# Patient Record
Sex: Female | Born: 1965
Health system: Southern US, Community
[De-identification: ages and names within clinical notes are randomized; demographics above are authoritative.]

## PROBLEM LIST (undated history)

## (undated) DIAGNOSIS — F419 Anxiety disorder, unspecified: Secondary | ICD-10-CM

## (undated) HISTORY — PX: GYNECOLOGIC CRYOSURGERY: SHX857

## (undated) HISTORY — PX: WISDOM TOOTH EXTRACTION: SHX21

---

## 2001-05-20 ENCOUNTER — Emergency Department (HOSPITAL_COMMUNITY): Admission: EM | Admit: 2001-05-20 | Discharge: 2001-05-20 | Payer: Self-pay | Admitting: Emergency Medicine

## 2001-05-20 ENCOUNTER — Encounter: Payer: Self-pay | Admitting: Emergency Medicine

## 2001-06-16 ENCOUNTER — Other Ambulatory Visit: Admission: RE | Admit: 2001-06-16 | Discharge: 2001-06-16 | Payer: Self-pay | Admitting: Obstetrics and Gynecology

## 2005-10-22 ENCOUNTER — Other Ambulatory Visit: Admission: RE | Admit: 2005-10-22 | Discharge: 2005-10-22 | Payer: Self-pay | Admitting: Obstetrics and Gynecology

## 2005-11-26 ENCOUNTER — Ambulatory Visit (HOSPITAL_COMMUNITY): Admission: RE | Admit: 2005-11-26 | Discharge: 2005-11-26 | Payer: Self-pay | Admitting: Obstetrics and Gynecology

## 2006-11-04 ENCOUNTER — Other Ambulatory Visit: Admission: RE | Admit: 2006-11-04 | Discharge: 2006-11-04 | Payer: Self-pay | Admitting: Obstetrics and Gynecology

## 2007-11-10 ENCOUNTER — Other Ambulatory Visit: Admission: RE | Admit: 2007-11-10 | Discharge: 2007-11-10 | Payer: Self-pay | Admitting: Obstetrics and Gynecology

## 2007-11-24 ENCOUNTER — Ambulatory Visit (HOSPITAL_COMMUNITY): Admission: RE | Admit: 2007-11-24 | Discharge: 2007-11-24 | Payer: Self-pay | Admitting: Obstetrics and Gynecology

## 2013-05-25 ENCOUNTER — Other Ambulatory Visit: Payer: Self-pay | Admitting: Obstetrics and Gynecology

## 2013-05-25 ENCOUNTER — Other Ambulatory Visit (HOSPITAL_COMMUNITY)
Admission: RE | Admit: 2013-05-25 | Discharge: 2013-05-25 | Disposition: A | Discharge status: Home / Self Care | Payer: 59 | Source: Ambulatory Visit | Attending: Obstetrics and Gynecology | Admitting: Obstetrics and Gynecology

## 2013-05-25 DIAGNOSIS — Z01419 Encounter for gynecological examination (general) (routine) without abnormal findings: Secondary | ICD-10-CM | POA: Insufficient documentation

## 2013-05-25 DIAGNOSIS — Z1151 Encounter for screening for human papillomavirus (HPV): Secondary | ICD-10-CM | POA: Insufficient documentation

## 2013-05-28 ENCOUNTER — Other Ambulatory Visit: Payer: Self-pay

## 2013-05-28 DIAGNOSIS — Z1231 Encounter for screening mammogram for malignant neoplasm of breast: Secondary | ICD-10-CM

## 2013-06-22 ENCOUNTER — Ambulatory Visit
Admission: RE | Admit: 2013-06-22 | Discharge: 2013-06-22 | Disposition: A | Discharge status: Home / Self Care | Payer: 59 | Source: Ambulatory Visit

## 2013-06-22 DIAGNOSIS — Z1231 Encounter for screening mammogram for malignant neoplasm of breast: Secondary | ICD-10-CM

## 2014-06-14 ENCOUNTER — Other Ambulatory Visit (HOSPITAL_COMMUNITY)
Admission: RE | Admit: 2014-06-14 | Discharge: 2014-06-14 | Disposition: A | Discharge status: Home / Self Care | Payer: BC Managed Care – PPO | Source: Ambulatory Visit | Attending: Obstetrics and Gynecology | Admitting: Obstetrics and Gynecology

## 2014-06-14 ENCOUNTER — Other Ambulatory Visit: Payer: Self-pay | Admitting: Obstetrics and Gynecology

## 2014-06-14 DIAGNOSIS — Z01419 Encounter for gynecological examination (general) (routine) without abnormal findings: Secondary | ICD-10-CM | POA: Insufficient documentation

## 2014-06-16 ENCOUNTER — Other Ambulatory Visit: Payer: Self-pay

## 2014-06-16 DIAGNOSIS — Z1231 Encounter for screening mammogram for malignant neoplasm of breast: Secondary | ICD-10-CM

## 2014-06-16 LAB — CYTOLOGY - PAP

## 2014-07-19 ENCOUNTER — Other Ambulatory Visit: Payer: Self-pay | Admitting: Dermatology

## 2014-07-26 ENCOUNTER — Ambulatory Visit
Admission: RE | Admit: 2014-07-26 | Discharge: 2014-07-26 | Disposition: A | Discharge status: Home / Self Care | Payer: BC Managed Care – PPO | Source: Ambulatory Visit

## 2014-07-26 ENCOUNTER — Encounter (INDEPENDENT_AMBULATORY_CARE_PROVIDER_SITE_OTHER): Payer: Self-pay

## 2014-07-26 DIAGNOSIS — Z1231 Encounter for screening mammogram for malignant neoplasm of breast: Secondary | ICD-10-CM

## 2015-06-13 ENCOUNTER — Other Ambulatory Visit (HOSPITAL_COMMUNITY)
Admission: RE | Admit: 2015-06-13 | Discharge: 2015-06-13 | Disposition: A | Discharge status: Home / Self Care | Payer: BLUE CROSS/BLUE SHIELD | Source: Ambulatory Visit | Attending: Obstetrics and Gynecology | Admitting: Obstetrics and Gynecology

## 2015-06-13 ENCOUNTER — Other Ambulatory Visit: Payer: Self-pay | Admitting: Obstetrics and Gynecology

## 2015-06-13 DIAGNOSIS — Z01419 Encounter for gynecological examination (general) (routine) without abnormal findings: Secondary | ICD-10-CM | POA: Insufficient documentation

## 2015-06-14 LAB — CYTOLOGY - PAP

## 2015-06-27 ENCOUNTER — Other Ambulatory Visit: Payer: Self-pay

## 2015-06-27 DIAGNOSIS — Z1231 Encounter for screening mammogram for malignant neoplasm of breast: Secondary | ICD-10-CM

## 2015-08-01 ENCOUNTER — Ambulatory Visit
Admission: RE | Admit: 2015-08-01 | Discharge: 2015-08-01 | Disposition: A | Discharge status: Home / Self Care | Payer: BLUE CROSS/BLUE SHIELD | Source: Ambulatory Visit

## 2015-08-01 DIAGNOSIS — Z1231 Encounter for screening mammogram for malignant neoplasm of breast: Secondary | ICD-10-CM

## 2016-03-12 DIAGNOSIS — F419 Anxiety disorder, unspecified: Secondary | ICD-10-CM | POA: Diagnosis not present

## 2016-06-25 DIAGNOSIS — Z86018 Personal history of other benign neoplasm: Secondary | ICD-10-CM | POA: Diagnosis not present

## 2016-06-25 DIAGNOSIS — D225 Melanocytic nevi of trunk: Secondary | ICD-10-CM | POA: Diagnosis not present

## 2016-06-25 DIAGNOSIS — L814 Other melanin hyperpigmentation: Secondary | ICD-10-CM | POA: Diagnosis not present

## 2016-06-25 DIAGNOSIS — D1801 Hemangioma of skin and subcutaneous tissue: Secondary | ICD-10-CM | POA: Diagnosis not present

## 2016-07-02 ENCOUNTER — Other Ambulatory Visit: Payer: Self-pay | Admitting: Obstetrics and Gynecology

## 2016-07-02 DIAGNOSIS — Z1231 Encounter for screening mammogram for malignant neoplasm of breast: Secondary | ICD-10-CM

## 2016-07-30 DIAGNOSIS — F419 Anxiety disorder, unspecified: Secondary | ICD-10-CM | POA: Diagnosis not present

## 2016-08-20 ENCOUNTER — Other Ambulatory Visit (HOSPITAL_COMMUNITY)
Admission: RE | Admit: 2016-08-20 | Discharge: 2016-08-20 | Disposition: A | Discharge status: Home / Self Care | Payer: BLUE CROSS/BLUE SHIELD | Source: Ambulatory Visit | Attending: Obstetrics and Gynecology | Admitting: Obstetrics and Gynecology

## 2016-08-20 ENCOUNTER — Ambulatory Visit
Admission: RE | Admit: 2016-08-20 | Discharge: 2016-08-20 | Disposition: A | Discharge status: Home / Self Care | Payer: BLUE CROSS/BLUE SHIELD | Source: Ambulatory Visit | Attending: Obstetrics and Gynecology | Admitting: Obstetrics and Gynecology

## 2016-08-20 ENCOUNTER — Other Ambulatory Visit: Payer: Self-pay | Admitting: Obstetrics and Gynecology

## 2016-08-20 DIAGNOSIS — Z01419 Encounter for gynecological examination (general) (routine) without abnormal findings: Secondary | ICD-10-CM | POA: Insufficient documentation

## 2016-08-20 DIAGNOSIS — Z1151 Encounter for screening for human papillomavirus (HPV): Secondary | ICD-10-CM | POA: Diagnosis not present

## 2016-08-20 DIAGNOSIS — Z1231 Encounter for screening mammogram for malignant neoplasm of breast: Secondary | ICD-10-CM | POA: Diagnosis not present

## 2016-08-22 LAB — CYTOLOGY - PAP
Diagnosis: NEGATIVE
HPV (WINDOPATH): NOT DETECTED

## 2016-10-22 DIAGNOSIS — F419 Anxiety disorder, unspecified: Secondary | ICD-10-CM | POA: Diagnosis not present

## 2016-11-05 DIAGNOSIS — M21612 Bunion of left foot: Secondary | ICD-10-CM | POA: Diagnosis not present

## 2016-11-05 DIAGNOSIS — M7752 Other enthesopathy of left foot: Secondary | ICD-10-CM | POA: Diagnosis not present

## 2016-11-05 DIAGNOSIS — M21962 Unspecified acquired deformity of left lower leg: Secondary | ICD-10-CM | POA: Diagnosis not present

## 2016-11-05 DIAGNOSIS — M21961 Unspecified acquired deformity of right lower leg: Secondary | ICD-10-CM | POA: Diagnosis not present

## 2016-11-19 DIAGNOSIS — M21612 Bunion of left foot: Secondary | ICD-10-CM | POA: Diagnosis not present

## 2016-11-19 DIAGNOSIS — M7752 Other enthesopathy of left foot: Secondary | ICD-10-CM | POA: Diagnosis not present

## 2016-11-19 DIAGNOSIS — M21962 Unspecified acquired deformity of left lower leg: Secondary | ICD-10-CM | POA: Diagnosis not present

## 2016-11-19 DIAGNOSIS — M21961 Unspecified acquired deformity of right lower leg: Secondary | ICD-10-CM | POA: Diagnosis not present

## 2016-11-26 ENCOUNTER — Encounter: Payer: Self-pay | Admitting: Podiatry

## 2016-11-26 ENCOUNTER — Ambulatory Visit: Payer: BLUE CROSS/BLUE SHIELD

## 2016-11-26 ENCOUNTER — Ambulatory Visit (INDEPENDENT_AMBULATORY_CARE_PROVIDER_SITE_OTHER): Payer: BLUE CROSS/BLUE SHIELD | Admitting: Podiatry

## 2016-11-26 DIAGNOSIS — M21622 Bunionette of left foot: Secondary | ICD-10-CM | POA: Diagnosis not present

## 2016-11-26 DIAGNOSIS — M21612 Bunion of left foot: Secondary | ICD-10-CM

## 2016-11-26 DIAGNOSIS — M2012 Hallux valgus (acquired), left foot: Secondary | ICD-10-CM | POA: Diagnosis not present

## 2016-11-26 DIAGNOSIS — M21619 Bunion of unspecified foot: Secondary | ICD-10-CM

## 2016-11-26 DIAGNOSIS — M7752 Other enthesopathy of left foot: Secondary | ICD-10-CM | POA: Diagnosis not present

## 2016-11-26 DIAGNOSIS — M2042 Other hammer toe(s) (acquired), left foot: Secondary | ICD-10-CM

## 2016-11-26 MED ORDER — DICLOFENAC EPOLAMINE 1.3 % TD PTCH: 1.0000 | MEDICATED_PATCH | Freq: Two times a day (BID) | TRANSDERMAL | 10 refills | Status: DC

## 2016-11-26 MED ORDER — MELOXICAM 15 MG PO TABS: 15.0000 mg | ORAL_TABLET | Freq: Every day | ORAL | 1 refills | Status: AC

## 2016-11-27 ENCOUNTER — Telehealth: Payer: Self-pay | Admitting: *Deleted

## 2016-11-27 MED ORDER — NONFORMULARY OR COMPOUNDED ITEM: 2 refills | Status: AC

## 2016-11-27 NOTE — Telephone Encounter (Signed)
Please contact patient and give her the option.  Thanks, Dr. Logan BoresEvans

## 2016-11-27 NOTE — Telephone Encounter (Addendum)
Received notice Flextor patches are not in pt's insurance formulary. Left message informing pt the Flextor patch status and to call if would like to use the compound Dr. Logan BoresEvans orders. Pt states she would like to try the Compound. I gave pt the [facility] [phone], and explained they would call with insurance and delivery information. Pt states she would like to give Dr Logan BoresEvans a message, the shot he gave her yesterday did great, this is the most relief she has had in 4 month. Faxed orders to [facility].03/29/2017-Received request fax for Meloxicam. Refill denied pt was to reschedule for 1 week follow up. Return fax denying refill.04/12/2017-Received refill request for Meloxicam. Dr. Logan BoresEvans states pt needs to be seen prior to refills. Return fax denying Meloxicam refills.

## 2016-12-02 MED ORDER — BETAMETHASONE SOD PHOS & ACET 6 (3-3) MG/ML IJ SUSP: 3.0000 mg | Freq: Once | INTRAMUSCULAR | Status: AC

## 2016-12-02 NOTE — Progress Notes (Signed)
   Subjective:  Patient presents today as a new patient for evaluation of left foot pain is been going on for approximately 1 year now. Patient states is slowly progressing on worse. Patient states that she was seen at one point by a podiatrist who recommended surgery and nonweightbearing for approximately 3 months and refrain from work for approximately 6 months.. 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    Objective/Physical Exam General: The patient is alert and oriented x3 in no acute distress.  Dermatology: Skin is warm, dry and supple bilateral lower extremities. Negative for open lesions or macerations.  Vascular: Palpable pedal pulses bilaterally. No edema or erythema noted. Capillary refill within normal limits.  Neurological: Epicritic and protective threshold grossly intact bilaterally.   Musculoskeletal Exam: Clinical evidence of bunion deformity noted to the left lower extremity as well as tailor's bunion. Pain on palpation range of motion also noted to the second MPJ left foot.  Radiographic Exam:  Patient brought her x-rays from her last podiatrist visit approximately 3 weeks ago which do have indications of an increased intermetatarsal angle greater than 15 hallux adductus angle greater than 30. There is an intermetatarsal angle which is pathologically increase in the fourth interspace greater than 10. Hypertrophic lateral eminence of the fifth metatarsal head also noted.  Assessment: #1 hallux abductovalgus with bunion deformity left foot #2 tailor's bunion deformity left foot #3 second MPJ capsulitis left foot.   Plan of Care:  #1 Patient was evaluated. #2 today we discussed conservative versus surgical management of bunion and tailor's bunion as well as second MPJ capsulitis. Patient opts for surgical management. All details about complications of the surgery were explained. No guarantees were expressed or implied. #3  injection of 0.5 mL Celestone Soluspan injected in the second MPJ left foot. #4 prescription for meloxicam 15 mg Exline #5 prescription for diclofenac patches Exline #6 patient would like to have surgery in the fall of this year. Return to clinic in 6 months for authorization for surgery.  Patient is a Scientist, physiologicalveterinary ophthalmologist.   Felecia ShellingBrent M. Aamori Mcmasters, DPM [facility]  Dr. Felecia ShellingBrent M. Adithya Difrancesco, DPM    [address]                                        [place], [place] [address]                Office [phone]  Fax [phone]

## 2016-12-24 DIAGNOSIS — F419 Anxiety disorder, unspecified: Secondary | ICD-10-CM | POA: Diagnosis not present

## 2016-12-24 DIAGNOSIS — R748 Abnormal levels of other serum enzymes: Secondary | ICD-10-CM | POA: Diagnosis not present

## 2016-12-24 DIAGNOSIS — R740 Nonspecific elevation of levels of transaminase and lactic acid dehydrogenase [LDH]: Secondary | ICD-10-CM | POA: Diagnosis not present

## 2016-12-24 DIAGNOSIS — Z23 Encounter for immunization: Secondary | ICD-10-CM | POA: Diagnosis not present

## 2016-12-31 DIAGNOSIS — F419 Anxiety disorder, unspecified: Secondary | ICD-10-CM | POA: Diagnosis not present

## 2017-01-02 ENCOUNTER — Other Ambulatory Visit: Payer: Self-pay | Admitting: Family Medicine

## 2017-01-02 DIAGNOSIS — R748 Abnormal levels of other serum enzymes: Secondary | ICD-10-CM

## 2017-01-21 ENCOUNTER — Ambulatory Visit (INDEPENDENT_AMBULATORY_CARE_PROVIDER_SITE_OTHER): Payer: BLUE CROSS/BLUE SHIELD | Admitting: Podiatry

## 2017-01-21 DIAGNOSIS — M21619 Bunion of unspecified foot: Secondary | ICD-10-CM | POA: Diagnosis not present

## 2017-01-21 DIAGNOSIS — M21612 Bunion of left foot: Secondary | ICD-10-CM

## 2017-01-21 DIAGNOSIS — M2012 Hallux valgus (acquired), left foot: Secondary | ICD-10-CM

## 2017-01-21 DIAGNOSIS — M7752 Other enthesopathy of left foot: Secondary | ICD-10-CM | POA: Diagnosis not present

## 2017-01-21 DIAGNOSIS — M2042 Other hammer toe(s) (acquired), left foot: Secondary | ICD-10-CM

## 2017-01-21 DIAGNOSIS — M21622 Bunionette of left foot: Secondary | ICD-10-CM

## 2017-01-22 NOTE — Progress Notes (Signed)
Subjective:  Patient presents today for follow up evaluation of left foot pain. Patient states is progressively worsening. She states the injection was helpful, but the pain has now returned. Increased activity recently caused an exacerbation of the pain.   Objective/Physical Exam General: The patient is alert and oriented x3 in no acute distress.  Dermatology: Skin is warm, dry and supple bilateral lower extremities. Negative for open lesions or macerations.  Vascular: Palpable pedal pulses bilaterally. No edema or erythema noted. Capillary refill within normal limits.  Neurological: Epicritic and protective threshold grossly intact bilaterally.   Musculoskeletal Exam: Clinical evidence of bunion deformity noted to the left lower extremity as well as tailor's bunion. Pain on palpation range of motion also noted to the second MPJ left foot.   Assessment: #1 hallux abductovalgus with bunion deformity left foot #2 tailor's bunion deformity left foot #3 second MPJ capsulitis left foot.   Plan of Care:  #1 Patient was evaluated. #2 injection of 0.5 mL Celestone Soluspan injected in the second MPJ left foot. #3 silicone toe caps dispensed. #4 return to clinic 1 week postop.  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   Felecia Shelling, DPM [facility]  Dr. Felecia Shelling, DPM    [address]                                             [place], [place] [address]                Office [phone]  Fax [phone]

## 2017-01-27 MED ORDER — BETAMETHASONE SOD PHOS & ACET 6 (3-3) MG/ML IJ SUSP: 3.0000 mg | Freq: Once | INTRAMUSCULAR | Status: AC

## 2017-03-04 DIAGNOSIS — M79672 Pain in left foot: Secondary | ICD-10-CM | POA: Diagnosis not present

## 2017-03-04 DIAGNOSIS — M21612 Bunion of left foot: Secondary | ICD-10-CM | POA: Diagnosis not present

## 2017-03-04 DIAGNOSIS — M205X2 Other deformities of toe(s) (acquired), left foot: Secondary | ICD-10-CM | POA: Diagnosis not present

## 2017-03-04 DIAGNOSIS — G8929 Other chronic pain: Secondary | ICD-10-CM | POA: Diagnosis not present

## 2017-03-07 ENCOUNTER — Other Ambulatory Visit: Payer: Self-pay | Admitting: Orthopedic Surgery

## 2017-03-08 ENCOUNTER — Telehealth: Payer: Self-pay | Admitting: *Deleted

## 2017-03-08 NOTE — Telephone Encounter (Signed)
"  I am actually down for surgery with Dr. Clayburn PertEvan on the 8th of November.  I'm having some issues so I wanted to go ahead and cancel that for now.  Please cancel that and give me a call back to confirm that you did cancel that.  I'm not sure of where I'm going to be at that time.  It's fine to leave me a message confirming that it's canceled if I can't pick up."

## 2017-03-08 NOTE — Telephone Encounter (Signed)
I called and left her a message that I canceled her surgery for August 08, 2017.  I called and informed Ar[facility]ynthia [facility].

## 2017-03-21 ENCOUNTER — Encounter (HOSPITAL_BASED_OUTPATIENT_CLINIC_OR_DEPARTMENT_OTHER): Payer: Self-pay | Admitting: *Deleted

## 2017-03-25 ENCOUNTER — Other Ambulatory Visit: Payer: BLUE CROSS/BLUE SHIELD

## 2017-03-28 ENCOUNTER — Encounter (HOSPITAL_BASED_OUTPATIENT_CLINIC_OR_DEPARTMENT_OTHER): Payer: Self-pay

## 2017-03-28 ENCOUNTER — Ambulatory Visit (HOSPITAL_BASED_OUTPATIENT_CLINIC_OR_DEPARTMENT_OTHER): Payer: BLUE CROSS/BLUE SHIELD | Admitting: Certified Registered"

## 2017-03-28 ENCOUNTER — Ambulatory Visit (HOSPITAL_BASED_OUTPATIENT_CLINIC_OR_DEPARTMENT_OTHER)
Admission: RE | Admit: 2017-03-28 | Discharge: 2017-03-28 | Disposition: A | Discharge status: Home / Self Care | Payer: BLUE CROSS/BLUE SHIELD | Source: Ambulatory Visit | Attending: Orthopedic Surgery | Admitting: Orthopedic Surgery

## 2017-03-28 ENCOUNTER — Encounter (HOSPITAL_BASED_OUTPATIENT_CLINIC_OR_DEPARTMENT_OTHER)
Admission: RE | Disposition: A | Discharge status: Home / Self Care | Payer: Self-pay | Source: Ambulatory Visit | Attending: Orthopedic Surgery

## 2017-03-28 DIAGNOSIS — M205X2 Other deformities of toe(s) (acquired), left foot: Secondary | ICD-10-CM | POA: Diagnosis not present

## 2017-03-28 DIAGNOSIS — M2042 Other hammer toe(s) (acquired), left foot: Secondary | ICD-10-CM | POA: Insufficient documentation

## 2017-03-28 DIAGNOSIS — M21612 Bunion of left foot: Secondary | ICD-10-CM | POA: Diagnosis not present

## 2017-03-28 DIAGNOSIS — Z880 Allergy status to penicillin: Secondary | ICD-10-CM | POA: Insufficient documentation

## 2017-03-28 DIAGNOSIS — F419 Anxiety disorder, unspecified: Secondary | ICD-10-CM | POA: Insufficient documentation

## 2017-03-28 DIAGNOSIS — Z9889 Other specified postprocedural states: Secondary | ICD-10-CM

## 2017-03-28 DIAGNOSIS — Z79899 Other long term (current) drug therapy: Secondary | ICD-10-CM | POA: Insufficient documentation

## 2017-03-28 DIAGNOSIS — M216X2 Other acquired deformities of left foot: Secondary | ICD-10-CM | POA: Diagnosis not present

## 2017-03-28 DIAGNOSIS — G8918 Other acute postprocedural pain: Secondary | ICD-10-CM | POA: Diagnosis not present

## 2017-03-28 HISTORY — PX: HAMMERTOE RECONSTRUCTION WITH WEIL OSTEOTOMY: SHX5631

## 2017-03-28 HISTORY — DX: Anxiety disorder, unspecified: F41.9

## 2017-03-28 HISTORY — PX: METATARSAL OSTEOTOMY: SHX1641

## 2017-03-28 SURGERY — OSTEOTOMY, METATARSAL BONE
Anesthesia: Regional | Laterality: Left

## 2017-03-28 MED ORDER — CEFAZOLIN SODIUM-DEXTROSE 2-4 GM/100ML-% IV SOLN
INTRAVENOUS | Status: AC
  Filled 2017-03-28: qty 100

## 2017-03-28 MED ORDER — KETOROLAC TROMETHAMINE 30 MG/ML IJ SOLN: 30.0000 mg | Freq: Once | INTRAMUSCULAR | Status: DC | PRN

## 2017-03-28 MED ORDER — FENTANYL CITRATE (PF) 100 MCG/2ML IJ SOLN
INTRAMUSCULAR | Status: AC
  Filled 2017-03-28: qty 2

## 2017-03-28 MED ORDER — OXYCODONE HCL 5 MG/5ML PO SOLN: 5.0000 mg | Freq: Once | ORAL | Status: DC | PRN

## 2017-03-28 MED ORDER — DOCUSATE SODIUM 100 MG PO CAPS: 100.0000 mg | ORAL_CAPSULE | Freq: Two times a day (BID) | ORAL | 0 refills | Status: AC

## 2017-03-28 MED ORDER — OXYCODONE HCL 5 MG PO TABS: 5.0000 mg | ORAL_TABLET | Freq: Once | ORAL | Status: DC | PRN

## 2017-03-28 MED ORDER — MEPERIDINE HCL 25 MG/ML IJ SOLN: 6.2500 mg | INTRAMUSCULAR | Status: DC | PRN

## 2017-03-28 MED ORDER — FENTANYL CITRATE (PF) 100 MCG/2ML IJ SOLN
50.0000 ug | INTRAMUSCULAR | Status: DC | PRN
  Administered 2017-03-28: 50 ug via INTRAVENOUS

## 2017-03-28 MED ORDER — LACTATED RINGERS IV SOLN
INTRAVENOUS | Status: DC
  Administered 2017-03-28 (×2): via INTRAVENOUS

## 2017-03-28 MED ORDER — HYDROMORPHONE HCL 1 MG/ML IJ SOLN: 0.2500 mg | INTRAMUSCULAR | Status: DC | PRN

## 2017-03-28 MED ORDER — MIDAZOLAM HCL 2 MG/2ML IJ SOLN
INTRAMUSCULAR | Status: AC
  Filled 2017-03-28: qty 2

## 2017-03-28 MED ORDER — CHLORHEXIDINE GLUCONATE 4 % EX LIQD: 60.0000 mL | Freq: Once | CUTANEOUS | Status: DC

## 2017-03-28 MED ORDER — PROMETHAZINE HCL 25 MG/ML IJ SOLN: 6.2500 mg | INTRAMUSCULAR | Status: DC | PRN

## 2017-03-28 MED ORDER — DEXAMETHASONE SODIUM PHOSPHATE 10 MG/ML IJ SOLN
INTRAMUSCULAR | Status: DC | PRN
  Administered 2017-03-28: 10 mg via INTRAVENOUS

## 2017-03-28 MED ORDER — SODIUM CHLORIDE 0.9 % IV SOLN: INTRAVENOUS | Status: DC

## 2017-03-28 MED ORDER — ONDANSETRON HCL 4 MG/2ML IJ SOLN
INTRAMUSCULAR | Status: DC | PRN
  Administered 2017-03-28: 4 mg via INTRAVENOUS

## 2017-03-28 MED ORDER — OXYCODONE HCL 5 MG PO TABS: 5.0000 mg | ORAL_TABLET | ORAL | 0 refills | Status: AC | PRN

## 2017-03-28 MED ORDER — LIDOCAINE HCL (CARDIAC) 20 MG/ML IV SOLN
INTRAVENOUS | Status: DC | PRN
  Administered 2017-03-28: 30 mg via INTRAVENOUS

## 2017-03-28 MED ORDER — BUPIVACAINE-EPINEPHRINE (PF) 0.5% -1:200000 IJ SOLN
INTRAMUSCULAR | Status: DC | PRN
  Administered 2017-03-28: 30 mL via PERINEURAL

## 2017-03-28 MED ORDER — SENNA 8.6 MG PO TABS: 2.0000 | ORAL_TABLET | Freq: Two times a day (BID) | ORAL | 0 refills | Status: AC

## 2017-03-28 MED ORDER — SCOPOLAMINE 1 MG/3DAYS TD PT72: 1.0000 | MEDICATED_PATCH | Freq: Once | TRANSDERMAL | Status: DC | PRN

## 2017-03-28 MED ORDER — PROPOFOL 10 MG/ML IV BOLUS
INTRAVENOUS | Status: DC | PRN
  Administered 2017-03-28: 200 mg via INTRAVENOUS

## 2017-03-28 MED ORDER — CEFAZOLIN SODIUM-DEXTROSE 2-4 GM/100ML-% IV SOLN
2.0000 g | INTRAVENOUS | Status: AC
  Administered 2017-03-28: 2 g via INTRAVENOUS

## 2017-03-28 MED ORDER — MIDAZOLAM HCL 2 MG/2ML IJ SOLN
1.0000 mg | INTRAMUSCULAR | Status: DC | PRN
  Administered 2017-03-28: 2 mg via INTRAVENOUS

## 2017-03-28 SURGICAL SUPPLY — 84 items
BANDAGE ESMARK 6X9 LF (GAUZE/BANDAGES/DRESSINGS) IMPLANT
BIT DRILL 1.5X30 QC DISP (BIT) ×3 IMPLANT
BIT DRILL 1.8 CANN MAX VPC (BIT) ×3 IMPLANT
BIT DRILL 2.0 (BIT) ×1
BIT DRILL 2.0MM (BIT) ×1
BIT DRILL 2XNS DISP SS SM FRAG (BIT) ×1 IMPLANT
BIT DRL 2XNS DISP SS SM FRAG (BIT) ×1
BLADE AVERAGE 25MMX9MM (BLADE)
BLADE AVERAGE 25X9 (BLADE) IMPLANT
BLADE LONG MED 25X9 (BLADE) IMPLANT
BLADE LONG MED 25X9MM (BLADE)
BLADE OSC/SAG .038X5.5 CUT EDG (BLADE) IMPLANT
BLADE SURG 15 STRL LF DISP TIS (BLADE) ×2 IMPLANT
BLADE SURG 15 STRL SS (BLADE) ×4
BNDG COHESIVE 4X5 TAN STRL (GAUZE/BANDAGES/DRESSINGS) ×3 IMPLANT
BNDG COHESIVE 6X5 TAN STRL LF (GAUZE/BANDAGES/DRESSINGS) ×3 IMPLANT
BNDG CONFORM 2 STRL LF (GAUZE/BANDAGES/DRESSINGS) IMPLANT
BNDG CONFORM 3 STRL LF (GAUZE/BANDAGES/DRESSINGS) ×3 IMPLANT
BNDG ESMARK 4X9 LF (GAUZE/BANDAGES/DRESSINGS) IMPLANT
BNDG ESMARK 6X9 LF (GAUZE/BANDAGES/DRESSINGS)
CAP PIN PROTECTOR ORTHO WHT (CAP) IMPLANT
CHLORAPREP W/TINT 26ML (MISCELLANEOUS) ×3 IMPLANT
COVER BACK TABLE 60X90IN (DRAPES) ×3 IMPLANT
CUFF TOURNIQUET SINGLE 24IN (TOURNIQUET CUFF) IMPLANT
CUFF TOURNIQUET SINGLE 34IN LL (TOURNIQUET CUFF) IMPLANT
DRAPE EXTREMITY T 121X128X90 (DRAPE) ×3 IMPLANT
DRAPE OEC MINIVIEW 54X84 (DRAPES) ×3 IMPLANT
DRAPE U-SHAPE 47X51 STRL (DRAPES) ×3 IMPLANT
DRSG MEPITEL 4X7.2 (GAUZE/BANDAGES/DRESSINGS) ×3 IMPLANT
DRSG PAD ABDOMINAL 8X10 ST (GAUZE/BANDAGES/DRESSINGS) ×3 IMPLANT
ELECT REM PT RETURN 9FT ADLT (ELECTROSURGICAL) ×3
ELECTRODE REM PT RTRN 9FT ADLT (ELECTROSURGICAL) ×1 IMPLANT
GAUZE SPONGE 4X4 12PLY STRL (GAUZE/BANDAGES/DRESSINGS) ×3 IMPLANT
GAUZE SPONGE 4X4 12PLY STRL LF (GAUZE/BANDAGES/DRESSINGS) ×3 IMPLANT
GLOVE BIO SURGEON STRL SZ8 (GLOVE) ×3 IMPLANT
GLOVE BIOGEL PI IND STRL 8 (GLOVE) ×2 IMPLANT
GLOVE BIOGEL PI INDICATOR 8 (GLOVE) ×4
GLOVE ECLIPSE 8.0 STRL XLNG CF (GLOVE) ×3 IMPLANT
GOWN STRL REUS W/ TWL LRG LVL3 (GOWN DISPOSABLE) ×1 IMPLANT
GOWN STRL REUS W/ TWL XL LVL3 (GOWN DISPOSABLE) ×2 IMPLANT
GOWN STRL REUS W/TWL LRG LVL3 (GOWN DISPOSABLE) ×2
GOWN STRL REUS W/TWL XL LVL3 (GOWN DISPOSABLE) ×4
K-WIRE .054X4 (WIRE) IMPLANT
K-WIRE COCR 0.9X95 (WIRE) ×3
K-WIRE DBL END .054 LG (WIRE) IMPLANT
KWIRE COCR 0.9X95 (WIRE) ×1 IMPLANT
NEEDLE HYPO 22GX1.5 SAFETY (NEEDLE) IMPLANT
NS IRRIG 1000ML POUR BTL (IV SOLUTION) ×3 IMPLANT
PACK BASIN DAY SURGERY FS (CUSTOM PROCEDURE TRAY) ×3 IMPLANT
PAD CAST 4YDX4 CTTN HI CHSV (CAST SUPPLIES) ×1 IMPLANT
PADDING CAST ABS 4INX4YD NS (CAST SUPPLIES) ×2
PADDING CAST ABS COTTON 4X4 ST (CAST SUPPLIES) ×1 IMPLANT
PADDING CAST COTTON 4X4 STRL (CAST SUPPLIES) ×2
PASSER SUT SWANSON 36MM LOOP (INSTRUMENTS) IMPLANT
PENCIL BUTTON HOLSTER BLD 10FT (ELECTRODE) ×3 IMPLANT
SANITIZER HAND PURELL 535ML FO (MISCELLANEOUS) ×3 IMPLANT
SCREW 2.0 CORTICAL FT 20MM (Screw) ×3 IMPLANT
SCREW 2.0MM CORTICAL FT 22MM (Screw) ×3 IMPLANT
SCREW 2.0MM CORTICAL FT 28MM (Screw) ×3 IMPLANT
SCREW CORTICAL 2.0X16 (Screw) ×6 IMPLANT
SCREW HCS TWIST-OFF 2.0X12MM (Screw) ×3 IMPLANT
SCREW HCS TWIST-OFF 2.0X14MM (Screw) ×3 IMPLANT
SCREW MAX VPC  2.5X20 (Screw) ×2 IMPLANT
SCREW MAX VPC 2.5X20 (Screw) ×1 IMPLANT
SHEET MEDIUM DRAPE 40X70 STRL (DRAPES) ×3 IMPLANT
SLEEVE SCD COMPRESS KNEE MED (MISCELLANEOUS) ×3 IMPLANT
SPONGE LAP 18X18 X RAY DECT (DISPOSABLE) ×3 IMPLANT
STOCKINETTE 6  STRL (DRAPES) ×2
STOCKINETTE 6 STRL (DRAPES) ×1 IMPLANT
SUCTION FRAZIER HANDLE 10FR (MISCELLANEOUS) ×2
SUCTION TUBE FRAZIER 10FR DISP (MISCELLANEOUS) ×1 IMPLANT
SUT ETHILON 3 0 PS 1 (SUTURE) ×6 IMPLANT
SUT MNCRL AB 3-0 PS2 18 (SUTURE) ×3 IMPLANT
SUT VIC AB 0 SH 27 (SUTURE) IMPLANT
SUT VIC AB 2-0 SH 27 (SUTURE) ×4
SUT VIC AB 2-0 SH 27XBRD (SUTURE) ×2 IMPLANT
SUT VICRYL 0 UR6 27IN ABS (SUTURE) IMPLANT
SYR BULB 3OZ (MISCELLANEOUS) ×3 IMPLANT
SYR CONTROL 10ML LL (SYRINGE) IMPLANT
TOWEL OR 17X24 6PK STRL BLUE (TOWEL DISPOSABLE) ×3 IMPLANT
TUBE CONNECTING 20'X1/4 (TUBING) ×1
TUBE CONNECTING 20X1/4 (TUBING) ×2 IMPLANT
UNDERPAD 30X30 (UNDERPADS AND DIAPERS) ×3 IMPLANT
YANKAUER SUCT BULB TIP NO VENT (SUCTIONS) IMPLANT

## 2017-03-28 NOTE — Anesthesia Procedure Notes (Signed)
Procedure Name: LMA Insertion Date/Time: 03/28/2017 9:57 AM Performed by: Satcha Storlie D Pre-anesthesia Checklist: Patient identified, Emergency Drugs available, Suction available and Patient being monitored Patient Re-evaluated:Patient Re-evaluated prior to inductionOxygen Delivery Method: Circle system utilized Preoxygenation: Pre-oxygenation with 100% oxygen Intubation Type: IV induction Ventilation: Mask ventilation without difficulty LMA: LMA inserted LMA Size: 3.0 Number of attempts: 1 Airway Equipment and Method: Bite block Placement Confirmation: positive ETCO2 Tube secured with: Tape Dental Injury: Teeth and Oropharynx as per pre-operative assessment

## 2017-03-28 NOTE — Anesthesia Postprocedure Evaluation (Signed)
Anesthesia Post Note  Patient: Lori HymenKelly [name]  Procedure(s) Performed: Procedure(s) (LRB): Left 1st metatarsal Scarf osteotomy, modified McBride bunionectomy, possible Akin osteotomy; Left 2nd hammertoe correction; Weil osteotomy and flexor to extensor transfer (Left) HAMMERTOE RECONSTRUCTION WITH WEIL OSTEOTOMY EXCISION OF LEFT BUNIONETTE (Left)     Patient location during evaluation: PACU Anesthesia Type: Regional and General Level of consciousness: sedated and patient cooperative Pain management: pain level controlled Vital Signs Assessment: post-procedure vital signs reviewed and stable Respiratory status: spontaneous breathing Cardiovascular status: stable Anesthetic complications: no    Last Vitals:  Vitals:   03/28/17 1215 03/28/17 1230  BP: 118/86 129/83  Pulse: 91 80  Resp: (!) 21 18  Temp:  36.4 C    Last Pain:  Vitals:   03/28/17 1230  TempSrc:   PainSc: 0-No pain                 Lewie LoronJohn Camp Gopal

## 2017-03-28 NOTE — Transfer of Care (Signed)
Immediate Anesthesia Transfer of Care Note  Patient: Lori Madden  Procedure(s) Performed: Procedure(s): Left 1st metatarsal Scarf osteotomy, modified McBride bunionectomy, possible Akin osteotomy; Left 2nd hammertoe correction; Weil osteotomy and flexor to extensor transfer (Left) HAMMERTOE RECONSTRUCTION WITH WEIL OSTEOTOMY EXCISION OF LEFT BUNIONETTE (Left)  Patient Location: PACU  Anesthesia Type:GA combined with regional for post-op pain  Level of Consciousness: awake and patient cooperative  Airway & Oxygen Therapy: Patient Spontanous Breathing and Patient connected to face mask oxygen  Post-op Assessment: Report given to RN and Post -op Vital signs reviewed and stable  Post vital signs: Reviewed and stable  Last Vitals:  Vitals:   03/28/17 0920 03/28/17 1146  BP:  98/67  Pulse: 66 75  Resp: 17 (P) 18  Temp:      Last Pain:  Vitals:   03/28/17 0813  TempSrc: Oral  PainSc: 8       Patients Stated Pain Goal: 1 (03/28/17 0813)  Complications: No apparent anesthesia complications

## 2017-03-28 NOTE — Brief Op Note (Addendum)
03/28/2017  11:48 AM  PATIENT:  Lori Madden  51 y.o. female  PRE-OPERATIVE DIAGNOSIS:  Left 2nd hammertoe and bunion  POST-OPERATIVE DIAGNOSIS:  Left 2nd hammertoe and bunion  Procedure(s): 1.  Left modified McBride bunionectomy, 1st MT scarf and Akin osteotomies 2.  Left 2nd MT Weil osteotomy with dorsal capsulotomy and extensor tendon lengthening.(separate incision) 3.  Left 2nd hammertoe correction with flexor to extensor transfer (separate incision) 4.  Left 2nd toe open flexor tendon release (separate incision) 5.  Left foot AP and lateral xrays 6.  Excision of left foot bunionette  SURGEON:  Toni ArthursJohn Jaylynn Siefert, MD  ASSISTANT: Alfredo MartinezJustin Ollis, PA-C  ANESTHESIA:   General, regional  EBL:  minimal   TOURNIQUET:  approx 90 min at 250 mm Hg  COMPLICATIONS:  None apparent  DISPOSITION:  Extubated, awake and stable to recovery.  DICTATION ID:  [id number]

## 2017-03-28 NOTE — Anesthesia Procedure Notes (Addendum)
Anesthesia Regional Block: Popliteal block   Pre-Anesthetic Checklist: ,, timeout performed, Correct Patient, Correct Site, Correct Laterality, Correct Procedure, Correct Position, site marked, Risks and benefits discussed,  Surgical consent,  Pre-op evaluation,  At surgeon's request and post-op pain management  Laterality: Left  Prep: chloraprep       Needles:  Injection technique: Single-shot  Needle Type: Stimiplex     Needle Length: 10cm  Needle Gauge: 21     Additional Needles:   Procedures: ultrasound guided,,,,,,,,  Motor weakness within 5 minutes.   Nerve Stimulator or Paresthesia:  Response: Plantar flexion/toe flexion, 0.5 mA,   Additional Responses:   Narrative:  Start time: 03/28/2017 9:05 AM End time: 03/28/2017 9:11 AM Injection made incrementally with aspirations every 5 mL.  Performed by: Personally  Anesthesiologist: Lewie LoronGERMEROTH, Taygan Connell  Additional Notes: Nerve located and needle positioned with direct ultrasound guidance. Good perineural spread. Patient tolerated well.

## 2017-03-28 NOTE — H&P (Signed)
Lori Madden is an 51 y.o. female.   Chief Complaint: left foot pain HPI: 51 y/o female with left foot pain for many months.  She has a painful bunion and hammertoe deformity as well as a bunionette.  She has failed non op treatment and presents today for surgical correction of these painful deformities.  Past Medical History:  Diagnosis Date  . Anxiety     Past Surgical History:  Procedure Laterality Date  . GYNECOLOGIC CRYOSURGERY    . WISDOM TOOTH EXTRACTION      History reviewed. No pertinent family history. Social History:  has an unknown smoking status. She has never used smokeless tobacco. She reports that she does not drink alcohol or use drugs.  Allergies:  Allergies  Allergen Reactions  . Penicillins Hives  . Tylenol [Acetaminophen]     Facility-Administered Medications Prior to Admission  Medication Dose Route Frequency Provider Last Rate Last Dose  . betamethasone acetate-betamethasone sodium phosphate (CELESTONE) injection 3 mg  3 mg Intramuscular Once Gala LewandowskyEvans, Brent M, DPM      . betamethasone acetate-betamethasone sodium phosphate (CELESTONE) injection 3 mg  3 mg Intramuscular Once Felecia ShellingEvans, Brent M, DPM       Medications Prior to Admission  Medication Sig Dispense Refill  . cetirizine (ZYRTEC) 5 MG tablet Take 5 mg by mouth daily.    . clonazePAM (KLONOPIN) 0.5 MG tablet Take 0.5 mg by mouth 2 (two) times daily as needed for anxiety.    . meloxicam (MOBIC) 15 MG tablet Take 15 mg by mouth daily.    . metronidazole (NORITATE) 1 % cream Apply topically daily.    . NONFORMULARY OR COMPOUNDED ITEM [facility]:  Achilles Tendonitis Cream - Diclofenac 3%, Baclofen 2%, Bupivacaine 1%, Gabapentin 6%, Ibuprofen 3%, Pentoxifylline 3%, apply 1-2 grams to affected area 3-4 times daily. 120 each 2    No results found for this or any previous visit (from the past 48 hour(s)). No results found.  ROS  No recent f/c/n/v/wt loss  Blood pressure 98/64, pulse 66, temperature  97.7 F (36.5 C), temperature source Oral, resp. rate 17, height 5\' 2"  (1.575 m), weight 57.2 kg (126 lb), last menstrual period 03/07/2017, SpO2 99 %. Physical Exam  wn wd woman in nad.  A and ox 4.  Mood and affect normal.  EOMI.  resp unlabored.  L foot with healthy skin.  No lymphadenopathy.  5/5 strength inPF and DF of the ankle and toes.  Sens to LT intact at the forefoot.  Pulses palpable.  L moderate bunion and severe 2nd hammertoe.  Moderat bunionette.  Assessment/Plan L bunionette, bunion and 2nd hammertoe - to OR for operative correction.  The risks and benefits of the alternative treatment options have been discussed in detail.  The patient wishes to proceed with surgery and specifically understands risks of bleeding, infection, nerve damage, blood clots, need for additional surgery, amputation and death.   Toni ArthursHEWITT, Niraj Kudrna, MD 03/28/2017, 9:40 AM

## 2017-03-28 NOTE — Discharge Instructions (Addendum)
Lori ArthursJohn Hewitt, MD [facility]  Please read the following information regarding your care after surgery.  Medications  You only need a prescription for the narcotic pain medicine (ex. oxycodone, Percocet, Norco).  All of the other medicines listed below are available over the counter. X acetominophen (Tylenol) 650 mg every 4-6 hours as you need for minor pain X oxycodone as prescribed for moderate to severe pain X meloxicam as previously prescribed that you have at home as needed for pain.  Narcotic pain medicine (ex. oxycodone, Percocet, Vicodin) will cause constipation.  To prevent this problem, take the following medicines while you are taking any pain medicine. X docusate sodium (Colace) 100 mg twice a day X senna (Senokot) 2 tablets twice a day   Weight Bearing X Bear weight only on the heel of your operated foot in the post-op shoe.  Dressing X Keep your dressing clean and dry.  Dont put anything (coat hanger, pencil, etc) down inside of it.  If it gets damp, use a hair dryer on the cool setting to dry it.  If it gets soaked, call the office to schedule an appointment for a dressing change.   After your dressing, cast or splint is removed; you may shower, but do not soak or scrub the wound.  Allow the water to run over it, and then gently pat it dry.  Swelling It is normal for you to have swelling where you had surgery.  To reduce swelling and pain, keep your toes above your nose for at least 3 days after surgery.  It may be necessary to keep your foot or leg elevated for several weeks.  If it hurts, it should be elevated.  Follow Up Call my office at [phone] when you are discharged from the hospital or surgery center to schedule an appointment to be seen two weeks after surgery.  Call my office at [phone] if you develop a fever >101.5 F, nausea, vomiting, bleeding from the surgical site or severe pain.     Post Anesthesia Home Care  Instructions  Activity: Get plenty of rest for the remainder of the day. A responsible individual must stay with you for 24 hours following the procedure.  For the next 24 hours, DO NOT: -Drive a car -Advertising copywriterperate machinery -Drink alcoholic beverages -Take any medication unless instructed by your physician -Make any legal decisions or sign important papers.  Meals: Start with liquid foods such as gelatin or soup. Progress to regular foods as tolerated. Avoid greasy, spicy, heavy foods. If nausea and/or vomiting occur, drink only clear liquids until the nausea and/or vomiting subsides. Call your physician if vomiting continues.  Special Instructions/Symptoms: Your throat may feel dry or sore from the anesthesia or the breathing tube placed in your throat during surgery. If this causes discomfort, gargle with warm salt water. The discomfort should disappear within 24 hours.  If you had a scopolamine patch placed behind your ear for the management of post- operative nausea and/or vomiting:  1. The medication in the patch is effective for 72 hours, after which it should be removed.  Wrap patch in a tissue and discard in the trash. Wash hands thoroughly with soap and water. 2. You may remove the patch earlier than 72 hours if you experience unpleasant side effects which may include dry mouth, dizziness or visual disturbances. 3. Avoid touching the patch. Wash your hands with soap and water after contact with the patch.  Regional Anesthesia Blocks  1. Numbness or the inability to  move the "blocked" extremity may last from 3-48 hours after placement. The length of time depends on the medication injected and your individual response to the medication. If the numbness is not going away after 48 hours, call your surgeon.  2. The extremity that is blocked will need to be protected until the numbness is gone and the  Strength has returned. Because you cannot feel it, you will need to take extra care to  avoid injury. Because it may be weak, you may have difficulty moving it or using it. You may not know what position it is in without looking at it while the block is in effect.  3. For blocks in the legs and feet, returning to weight bearing and walking needs to be done carefully. You will need to wait until the numbness is entirely gone and the strength has returned. You should be able to move your leg and foot normally before you try and bear weight or walk. You will need someone to be with you when you first try to ensure you do not fall and possibly risk injury.  4. Bruising and tenderness at the needle site are common side effects and will resolve in a few days.  5. Persistent numbness or new problems with movement should be communicated to the surgeon or the [facility] [phone] [facility] [phone]).

## 2017-03-28 NOTE — Op Note (Signed)
NAMMarland Kitchen:  Lori Madden, [name]                 ACCOUNT NO.:  [id number]  MEDICAL RECORD NO.:  [id number]  LOCATION:                                 FACILITY:  PHYSICIAN:  Toni ArthursJohn Eluterio Seymour, MD             DATE OF BIRTH:  DATE OF PROCEDURE:  03/28/2017 DATE OF DISCHARGE:                              OPERATIVE REPORT   PREOPERATIVE DIAGNOSES: 1. Left second hammertoe deformity. 2. Left painful bunion deformity.  POSTOPERATIVE DIAGNOSES: 1. Left second hammertoe deformity. 2. Left painful bunion deformity.  PROCEDURE: 1. Left modified McBride bunionectomy, first metatarsal Scarf and Akin     osteotomies. 2. Left second metatarsal Weil osteotomy with dorsal capsulotomy and     extensor tendon lengthening through a separate incision. 3. Left second hammertoe correction with flexor to extensor transfer     through a separate incision. 4. Left second toe open flexor tendon release through a separate     incision. 5. Left foot AP and lateral radiographs. 6. Excision of left fifth metatarsal bunionette through a separate     incision.  SURGEON:  Toni ArthursJohn Jovonni Borquez, MD  ASSISTANT:  Alfredo MartinezJustin Ollis, PA-C.  ANESTHESIA:  General, regional.  ESTIMATED BLOOD LOSS:  Minimal.  TOURNIQUET TIME:  Approximately 90 minutes at 250 mmHg.  COMPLICATIONS:  None apparent.  DISPOSITION:  Extubated, awake, and stable to Recovery.  INDICATIONS FOR PROCEDURE:  The patient is a 51 year old woman who has a long history of left forefoot pain.  She has noticed progressive deformity at the site of her bunion as well as the second hammertoe and bunionette. The patient has failed nonoperative treatment to date including activity modification, shoe modification, and oral anti- inflammatories.  She presents now for surgical correction of these painful deformities.  She understands the risks and benefits of the alternative treatment options and elects surgical treatment.  She specifically understands risks of bleeding,  infection, nerve damage, blood clots, need for additional surgery, continued pain, nonunion, recurrence of her deformities, amputation, and death.  PROCEDURE IN DETAIL:  After preoperative consent was obtained and the correct operative site was identified, the patient was brought to the operating room and placed supine on the operating table.  General anesthesia was induced.  Preoperative antibiotics were administered. Surgical time-out was taken.  The left lower extremity was prepped and draped in standard sterile fashion with tourniquet around the thigh. Extremity was exsanguinated, and tourniquet was inflated to 250 mmHg.  A curvilinear incision was made over the dorsum of the second MTP joint, curving down into the first webspace.  Dissection was carried down through the skin and subcutaneous tissues.  The intermetatarsal ligament was divided.  An arthrotomy was then made between the lateral sesamoid and the metatarsal head.  Small perforations were then made in the lateral joint capsule.  The hallux could then be positioned in 20 degrees of varus passively.  Attention was then turned to the medial eminence where a longitudinal incision was made.  Dissection was carried down through the skin and subcutaneous tissues.  The medial joint capsule was incised and elevated plantarly and dorsally.  The first metatarsal shaft was exposed.  A Scarf osteotomy was then made with the oscillating saw.  The head of the metatarsal was mobilized and translated laterally correcting the intermetatarsal and hallux valgus angles.  The osteotomy was fixed with three 2-mm Biomet mini frag screws.  The overhanging bone medially was trimmed with the oscillating saw.  AP and lateral radiographs confirmed appropriate position of the osteotomy and appropriate alignment of the hardware.  The patient still had a bit of residual hallux valgus interphalangeus.  Decision was made at that time to proceed with an  Akin osteotomy.  The medial incision was extended distally and the base of the proximal phalanx was exposed.  The flexor and extensor tendons were protected, and a closing wedge osteotomy was made medially.  A small wedge of bone was removed and the osteotomy was fixed with a 2-mm screw. AP and lateral radiographs showed appropriate position of the osteotomy and the screw.  Attention was then turned to the second MTP joint where a capsulotomy was made exposing the metatarsal head.  The plantar plate was noted to be completely torn with the proximal phalanx dislocated dorsal to the metatarsal head.  The joint was reduced, and a Weil osteotomy was made with the oscillating saw.  The head was allowed to retract proximally several millimeters and was fixed with a Biomet FRS screw.  The overhanging bone was trimmed.  The toe was still not completely reduced.  The extensor tendons were released, and the dorsal joint capsule was excised.  The second toe was then addressed.  A transverse incision was made over the PIP joint. Dissection was carried down through the skin and subcutaneous tissues. The base of the middle phalanx and the head of the proximal phalanx were resected with the oscillating saw.  The joint was reduced and fixed with a 2.5-mm Biomet VPC screw.  An oblique incision was then made beneath the MTP joint.  The flexor tendon sheath was exposed and incised.  The flexor digitorum longus tendon was released through an incision at the distal portion of the toe.  The tendon was then pulled out through the proximal incision and passed to the dorsal incision along the lateral aspect of the proximal phalanx.  The toe was reduced and the extensor tendon was repaired to the dorsal periosteum with 2-0 Vicryl sutures. The toe was noted to be appropriately reduced and the MP joint now stable.  The extensor tendons were repaired with Vicryl sutures.  Attention was then turned to the lateral  aspect of the forefoot.  A curvilinear incision was made at the lateral aspect of the fifth metatarsal head.  Dissection was carried down through the skin and subcutaneous tissue.  The joint capsule was incised and elevated exposing the bunionette.  Bunionette was excised with the oscillating saw.  The wound was irrigated.  Final AP and lateral radiographs showed appropriate position and length of all hardware and appropriate correction of the forefoot deformities.  The lateral joint capsule was repaired with Vicryl.  The skin incision was closed with nylon.  The medial joint capsule was also repaired with imbricating sutures of 2-0 Vicryl, and the skin incision was closed with nylon.  The remaining incisions were closed with Monocryl and nylon.  Sterile dressings were applied followed by a well-padded bunion dressing.  Tourniquet was released after application of the dressings at approximately 90 minutes. The patient was awakened from anesthesia and transported to the recovery room in stable condition.  FOLLOWUP PLAN:  The patient will be weightbearing  as tolerated on her left heel in a Darco shoe.  She will follow up with me in the office in 2 weeks for suture removal and to start a toe spacer.  Alfredo Martinez, PA-C, was present and scrubbed for the duration of the case.  His assistance was essential in positioning the patient, prepping and draping, gaining and maintaining exposure, performing the operation, closing and dressing the wounds and applying the splint.  RADIOGRAPHS:  AP and lateral radiographs of the left foot were obtained intraoperatively.  These show interval correction of hammertoe, bunion, and bunionette deformities.  Hardware was appropriately positioned and of the appropriate length.  No other acute injuries were noted.     Toni Arthurs, MD     JH/MEDQ  D:  03/28/2017  T:  03/28/2017  Job:  [id number]

## 2017-03-28 NOTE — Anesthesia Preprocedure Evaluation (Signed)
Anesthesia Evaluation  Patient identified by MRN, date of birth, ID band Patient awake    Reviewed: Allergy & Precautions, NPO status , Patient's Chart, lab work & pertinent test results  Airway Mallampati: II  TM Distance: >3 FB Neck ROM: Full    Dental no notable dental hx.    Pulmonary neg pulmonary ROS,    Pulmonary exam normal breath sounds clear to auscultation       Cardiovascular negative cardio ROS Normal cardiovascular exam Rhythm:Regular Rate:Normal     Neuro/Psych negative neurological ROS  negative psych ROS   GI/Hepatic negative GI ROS, Neg liver ROS,   Endo/Other  negative endocrine ROS  Renal/GU negative Renal ROS     Musculoskeletal negative musculoskeletal ROS (+)   Abdominal   Peds  Hematology negative hematology ROS (+)   Anesthesia Other Findings   Reproductive/Obstetrics negative OB ROS                             Anesthesia Physical Anesthesia Plan  ASA: II  Anesthesia Plan: General and Regional   Post-op Pain Management: GA combined w/ Regional for post-op pain   Induction: Intravenous  PONV Risk Score and Plan: 3 and Ondansetron, Dexamethasone, Propofol and Midazolam  Airway Management Planned: LMA  Additional Equipment:   Intra-op Plan:   Post-operative Plan: Extubation in OR  Informed Consent: I have reviewed the patients History and Physical, chart, labs and discussed the procedure including the risks, benefits and alternatives for the proposed anesthesia with the patient or authorized representative who has indicated his/her understanding and acceptance.   Dental advisory given  Plan Discussed with: CRNA  Anesthesia Plan Comments:         Anesthesia Quick Evaluation

## 2017-03-28 NOTE — Progress Notes (Signed)
Assisted Dr. Germeroth with left, ultrasound guided, popliteal block. Side rails up, monitors on throughout procedure. See vital signs in flow sheet. Tolerated Procedure well. 

## 2017-03-29 ENCOUNTER — Encounter (HOSPITAL_BASED_OUTPATIENT_CLINIC_OR_DEPARTMENT_OTHER): Payer: Self-pay | Admitting: Orthopedic Surgery

## 2017-04-12 DIAGNOSIS — M79672 Pain in left foot: Secondary | ICD-10-CM | POA: Diagnosis not present

## 2017-04-12 DIAGNOSIS — G8929 Other chronic pain: Secondary | ICD-10-CM | POA: Diagnosis not present

## 2017-05-10 DIAGNOSIS — M79672 Pain in left foot: Secondary | ICD-10-CM | POA: Diagnosis not present

## 2017-05-10 DIAGNOSIS — G8929 Other chronic pain: Secondary | ICD-10-CM | POA: Diagnosis not present

## 2017-05-20 DIAGNOSIS — F419 Anxiety disorder, unspecified: Secondary | ICD-10-CM | POA: Diagnosis not present

## 2017-05-27 ENCOUNTER — Other Ambulatory Visit: Payer: BLUE CROSS/BLUE SHIELD

## 2017-06-10 DIAGNOSIS — M21612 Bunion of left foot: Secondary | ICD-10-CM | POA: Diagnosis not present

## 2017-06-17 ENCOUNTER — Ambulatory Visit: Payer: BLUE CROSS/BLUE SHIELD | Admitting: Podiatry

## 2017-07-19 DIAGNOSIS — L600 Ingrowing nail: Secondary | ICD-10-CM | POA: Diagnosis not present

## 2017-07-19 DIAGNOSIS — M79672 Pain in left foot: Secondary | ICD-10-CM | POA: Diagnosis not present

## 2017-07-29 ENCOUNTER — Other Ambulatory Visit: Payer: BLUE CROSS/BLUE SHIELD

## 2017-08-19 DIAGNOSIS — F419 Anxiety disorder, unspecified: Secondary | ICD-10-CM | POA: Diagnosis not present

## 2017-08-19 DIAGNOSIS — F332 Major depressive disorder, recurrent severe without psychotic features: Secondary | ICD-10-CM | POA: Diagnosis not present

## 2017-08-26 DIAGNOSIS — Z01419 Encounter for gynecological examination (general) (routine) without abnormal findings: Secondary | ICD-10-CM | POA: Diagnosis not present

## 2017-08-26 DIAGNOSIS — F330 Major depressive disorder, recurrent, mild: Secondary | ICD-10-CM | POA: Diagnosis not present

## 2017-08-29 ENCOUNTER — Other Ambulatory Visit: Payer: Self-pay | Admitting: Obstetrics and Gynecology

## 2017-08-29 DIAGNOSIS — Z1231 Encounter for screening mammogram for malignant neoplasm of breast: Secondary | ICD-10-CM

## 2017-09-30 ENCOUNTER — Ambulatory Visit
Admission: RE | Admit: 2017-09-30 | Discharge: 2017-09-30 | Disposition: A | Discharge status: Home / Self Care | Payer: BLUE CROSS/BLUE SHIELD | Source: Ambulatory Visit | Attending: Obstetrics and Gynecology | Admitting: Obstetrics and Gynecology

## 2017-09-30 DIAGNOSIS — Z1231 Encounter for screening mammogram for malignant neoplasm of breast: Secondary | ICD-10-CM | POA: Diagnosis not present

## 2017-10-21 DIAGNOSIS — F419 Anxiety disorder, unspecified: Secondary | ICD-10-CM | POA: Diagnosis not present

## 2017-10-21 DIAGNOSIS — F330 Major depressive disorder, recurrent, mild: Secondary | ICD-10-CM | POA: Diagnosis not present

## 2017-10-28 DIAGNOSIS — F322 Major depressive disorder, single episode, severe without psychotic features: Secondary | ICD-10-CM | POA: Diagnosis not present

## 2017-10-28 DIAGNOSIS — F419 Anxiety disorder, unspecified: Secondary | ICD-10-CM | POA: Diagnosis not present

## 2017-12-02 ENCOUNTER — Other Ambulatory Visit: Payer: BLUE CROSS/BLUE SHIELD

## 2017-12-09 DIAGNOSIS — F322 Major depressive disorder, single episode, severe without psychotic features: Secondary | ICD-10-CM | POA: Diagnosis not present

## 2017-12-09 DIAGNOSIS — F419 Anxiety disorder, unspecified: Secondary | ICD-10-CM | POA: Diagnosis not present

## 2017-12-23 ENCOUNTER — Ambulatory Visit
Admission: RE | Admit: 2017-12-23 | Discharge: 2017-12-23 | Disposition: A | Discharge status: Home / Self Care | Payer: BLUE CROSS/BLUE SHIELD | Source: Ambulatory Visit | Attending: Family Medicine | Admitting: Family Medicine

## 2017-12-23 DIAGNOSIS — R748 Abnormal levels of other serum enzymes: Secondary | ICD-10-CM | POA: Diagnosis not present

## 2017-12-30 DIAGNOSIS — K760 Fatty (change of) liver, not elsewhere classified: Secondary | ICD-10-CM | POA: Diagnosis not present

## 2017-12-31 ENCOUNTER — Other Ambulatory Visit: Payer: Self-pay | Admitting: Family Medicine

## 2017-12-31 DIAGNOSIS — K760 Fatty (change of) liver, not elsewhere classified: Secondary | ICD-10-CM

## 2018-03-03 DIAGNOSIS — F419 Anxiety disorder, unspecified: Secondary | ICD-10-CM | POA: Diagnosis not present

## 2018-05-05 DIAGNOSIS — F419 Anxiety disorder, unspecified: Secondary | ICD-10-CM | POA: Diagnosis not present

## 2018-06-23 DIAGNOSIS — D2271 Melanocytic nevi of right lower limb, including hip: Secondary | ICD-10-CM | POA: Diagnosis not present

## 2018-06-23 DIAGNOSIS — D485 Neoplasm of uncertain behavior of skin: Secondary | ICD-10-CM | POA: Diagnosis not present

## 2018-06-23 DIAGNOSIS — L814 Other melanin hyperpigmentation: Secondary | ICD-10-CM | POA: Diagnosis not present

## 2018-06-23 DIAGNOSIS — D1801 Hemangioma of skin and subcutaneous tissue: Secondary | ICD-10-CM | POA: Diagnosis not present

## 2018-06-23 DIAGNOSIS — D225 Melanocytic nevi of trunk: Secondary | ICD-10-CM | POA: Diagnosis not present

## 2018-06-23 DIAGNOSIS — Z86018 Personal history of other benign neoplasm: Secondary | ICD-10-CM | POA: Diagnosis not present

## 2018-07-21 DIAGNOSIS — F419 Anxiety disorder, unspecified: Secondary | ICD-10-CM | POA: Diagnosis not present

## 2018-09-01 DIAGNOSIS — Z01419 Encounter for gynecological examination (general) (routine) without abnormal findings: Secondary | ICD-10-CM | POA: Diagnosis not present

## 2018-09-04 ENCOUNTER — Other Ambulatory Visit: Payer: Self-pay | Admitting: Obstetrics and Gynecology

## 2018-09-04 DIAGNOSIS — Z1231 Encounter for screening mammogram for malignant neoplasm of breast: Secondary | ICD-10-CM

## 2018-09-15 DIAGNOSIS — F419 Anxiety disorder, unspecified: Secondary | ICD-10-CM | POA: Diagnosis not present

## 2018-10-13 ENCOUNTER — Ambulatory Visit: Payer: BLUE CROSS/BLUE SHIELD

## 2018-10-27 ENCOUNTER — Ambulatory Visit
Admission: RE | Admit: 2018-10-27 | Discharge: 2018-10-27 | Disposition: A | Discharge status: Home / Self Care | Payer: BLUE CROSS/BLUE SHIELD | Source: Ambulatory Visit | Attending: Obstetrics and Gynecology | Admitting: Obstetrics and Gynecology

## 2018-10-27 DIAGNOSIS — Z1231 Encounter for screening mammogram for malignant neoplasm of breast: Secondary | ICD-10-CM | POA: Diagnosis not present

## 2018-11-10 DIAGNOSIS — F419 Anxiety disorder, unspecified: Secondary | ICD-10-CM | POA: Diagnosis not present

## 2018-11-26 DIAGNOSIS — H168 Other keratitis: Secondary | ICD-10-CM | POA: Diagnosis not present

## 2018-12-16 ENCOUNTER — Other Ambulatory Visit: Payer: Self-pay | Admitting: Family Medicine

## 2018-12-16 DIAGNOSIS — K760 Fatty (change of) liver, not elsewhere classified: Secondary | ICD-10-CM

## 2018-12-29 ENCOUNTER — Other Ambulatory Visit: Payer: BLUE CROSS/BLUE SHIELD

## 2019-01-19 DIAGNOSIS — F419 Anxiety disorder, unspecified: Secondary | ICD-10-CM | POA: Diagnosis not present

## 2019-03-09 ENCOUNTER — Other Ambulatory Visit: Payer: BLUE CROSS/BLUE SHIELD

## 2019-04-16 ENCOUNTER — Other Ambulatory Visit: Payer: Self-pay | Admitting: Family Medicine

## 2019-04-16 DIAGNOSIS — K760 Fatty (change of) liver, not elsewhere classified: Secondary | ICD-10-CM

## 2019-07-29 ENCOUNTER — Other Ambulatory Visit: Payer: BLUE CROSS/BLUE SHIELD

## 2019-09-07 ENCOUNTER — Other Ambulatory Visit (HOSPITAL_COMMUNITY)
Admission: RE | Admit: 2019-09-07 | Discharge: 2019-09-07 | Disposition: A | Discharge status: Home / Self Care | Payer: 59 | Source: Ambulatory Visit | Attending: Obstetrics and Gynecology | Admitting: Obstetrics and Gynecology

## 2019-09-07 ENCOUNTER — Other Ambulatory Visit: Payer: Self-pay | Admitting: Obstetrics and Gynecology

## 2019-09-07 DIAGNOSIS — Z01419 Encounter for gynecological examination (general) (routine) without abnormal findings: Secondary | ICD-10-CM | POA: Insufficient documentation

## 2019-09-08 LAB — CYTOLOGY - PAP
Comment: NEGATIVE
Diagnosis: NEGATIVE
High risk HPV: NEGATIVE

## 2019-09-28 ENCOUNTER — Other Ambulatory Visit: Payer: Self-pay | Admitting: Obstetrics and Gynecology

## 2019-09-28 DIAGNOSIS — Z1231 Encounter for screening mammogram for malignant neoplasm of breast: Secondary | ICD-10-CM

## 2019-10-28 ENCOUNTER — Other Ambulatory Visit: Payer: BLUE CROSS/BLUE SHIELD

## 2019-11-17 ENCOUNTER — Other Ambulatory Visit: Payer: Self-pay

## 2019-11-17 ENCOUNTER — Ambulatory Visit
Admission: RE | Admit: 2019-11-17 | Discharge: 2019-11-17 | Disposition: A | Discharge status: Home / Self Care | Payer: 59 | Source: Ambulatory Visit | Attending: Obstetrics and Gynecology | Admitting: Obstetrics and Gynecology

## 2019-11-17 DIAGNOSIS — Z1231 Encounter for screening mammogram for malignant neoplasm of breast: Secondary | ICD-10-CM

## 2019-12-02 ENCOUNTER — Other Ambulatory Visit: Payer: 59

## 2019-12-29 ENCOUNTER — Ambulatory Visit
Admission: RE | Admit: 2019-12-29 | Discharge: 2019-12-29 | Disposition: A | Discharge status: Home / Self Care | Payer: 59 | Source: Ambulatory Visit | Attending: Family Medicine | Admitting: Family Medicine

## 2019-12-29 DIAGNOSIS — K760 Fatty (change of) liver, not elsewhere classified: Secondary | ICD-10-CM

## 2020-10-31 ENCOUNTER — Other Ambulatory Visit: Payer: Self-pay | Admitting: Obstetrics and Gynecology

## 2020-10-31 DIAGNOSIS — Z1231 Encounter for screening mammogram for malignant neoplasm of breast: Secondary | ICD-10-CM

## 2020-12-14 ENCOUNTER — Ambulatory Visit
Admission: RE | Admit: 2020-12-14 | Discharge: 2020-12-14 | Disposition: A | Discharge status: Home / Self Care | Payer: No Typology Code available for payment source | Source: Ambulatory Visit | Attending: Obstetrics and Gynecology | Admitting: Obstetrics and Gynecology

## 2020-12-14 ENCOUNTER — Other Ambulatory Visit: Payer: Self-pay

## 2020-12-14 DIAGNOSIS — Z1231 Encounter for screening mammogram for malignant neoplasm of breast: Secondary | ICD-10-CM

## 2020-12-16 ENCOUNTER — Other Ambulatory Visit: Payer: Self-pay | Admitting: Obstetrics and Gynecology

## 2020-12-16 DIAGNOSIS — R928 Other abnormal and inconclusive findings on diagnostic imaging of breast: Secondary | ICD-10-CM

## 2021-01-09 ENCOUNTER — Ambulatory Visit
Admission: RE | Admit: 2021-01-09 | Discharge: 2021-01-09 | Disposition: A | Discharge status: Home / Self Care | Payer: No Typology Code available for payment source | Source: Ambulatory Visit | Attending: Obstetrics and Gynecology | Admitting: Obstetrics and Gynecology

## 2021-01-09 ENCOUNTER — Other Ambulatory Visit: Payer: Self-pay

## 2021-01-09 ENCOUNTER — Other Ambulatory Visit: Payer: Self-pay | Admitting: Obstetrics and Gynecology

## 2021-01-09 DIAGNOSIS — R928 Other abnormal and inconclusive findings on diagnostic imaging of breast: Secondary | ICD-10-CM

## 2021-01-09 DIAGNOSIS — N6489 Other specified disorders of breast: Secondary | ICD-10-CM

## 2021-07-17 ENCOUNTER — Other Ambulatory Visit: Payer: Self-pay

## 2021-07-17 ENCOUNTER — Ambulatory Visit
Admission: RE | Admit: 2021-07-17 | Discharge: 2021-07-17 | Disposition: A | Discharge status: Home / Self Care | Payer: No Typology Code available for payment source | Source: Ambulatory Visit | Attending: Obstetrics and Gynecology | Admitting: Obstetrics and Gynecology

## 2021-07-17 DIAGNOSIS — N6489 Other specified disorders of breast: Secondary | ICD-10-CM

## 2022-03-13 IMAGING — US US ABDOMEN COMPLETE
1 series · 14 of 25 positions shown · non-contrast
Comparison: Abdominal ultrasound dated 12/23/2017

CLINICAL DATA: Fatty liver.

EXAM:
ABDOMEN ULTRASOUND COMPLETE

[Series 1: us abdomen complete · 0.15mm/px · 14 of 86 slices shown]
[im 1/86]
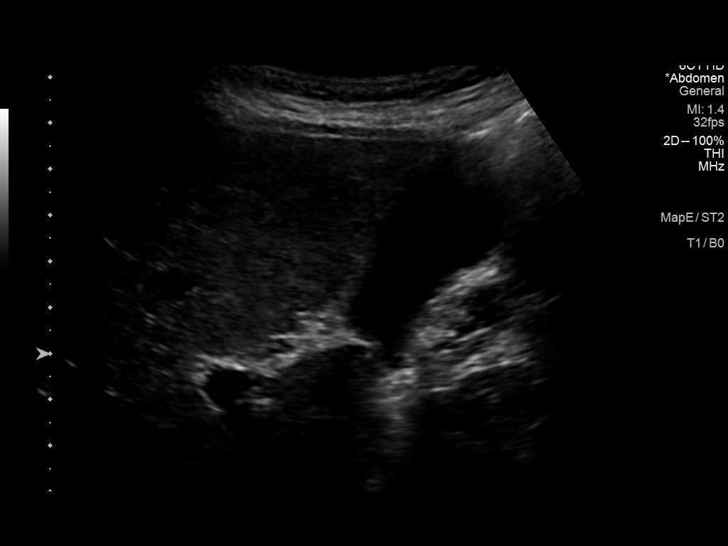
[im 8/86]
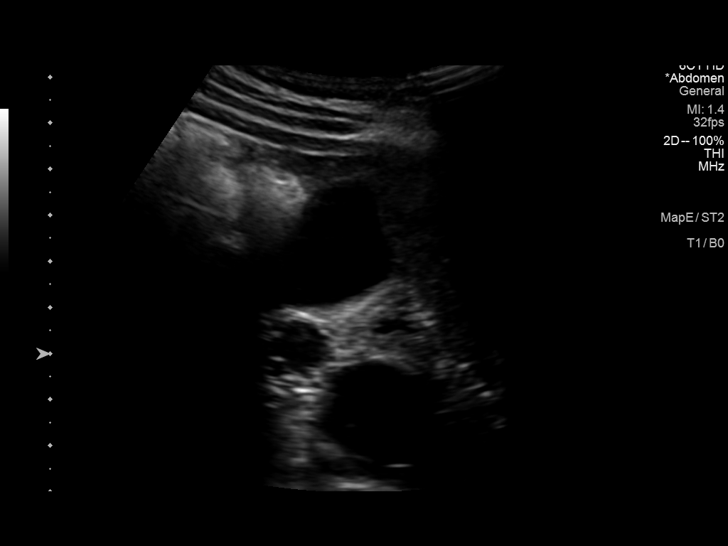
[im 15/86]
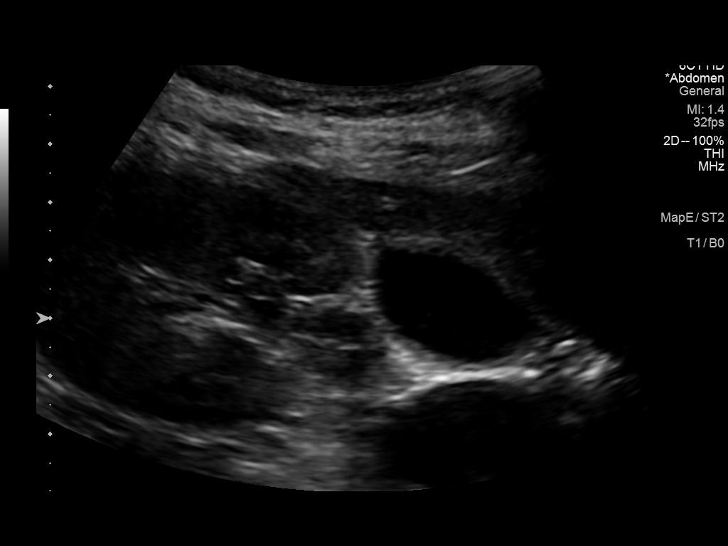
[im 22/86]
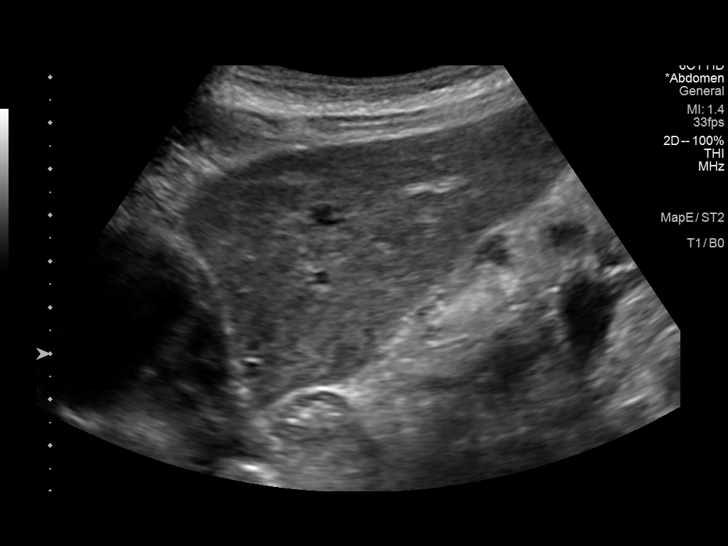
[im 29/86]
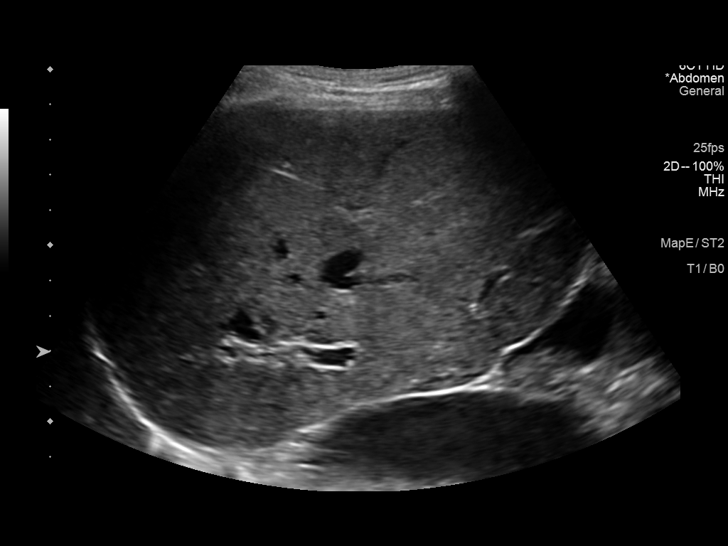
[im 32/86]
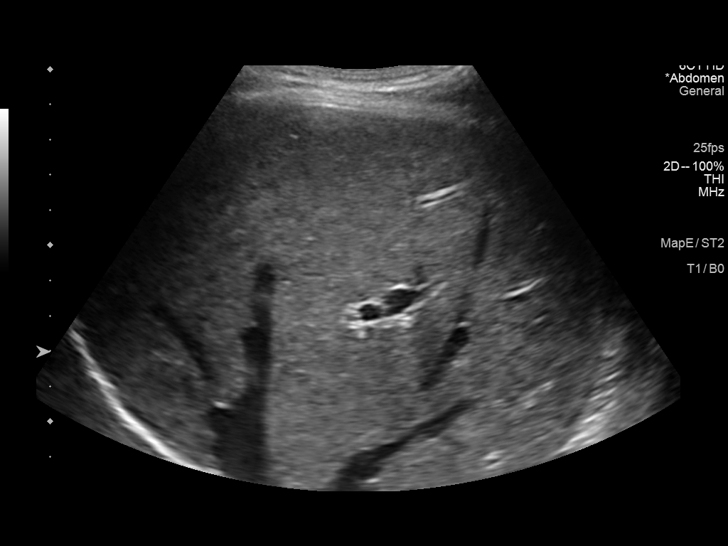
[im 39/86]
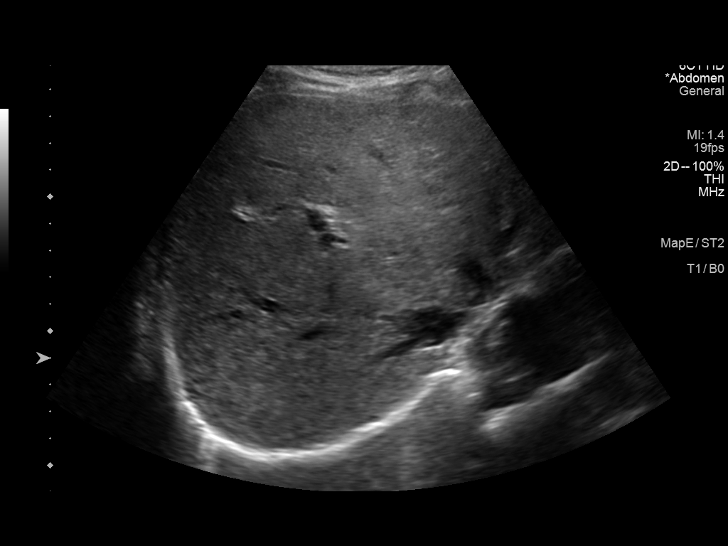
[im 47/86]
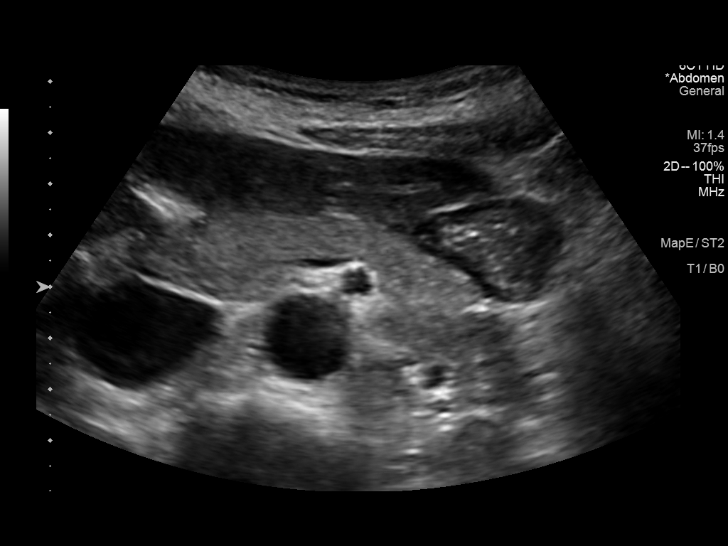
[im 54/86]
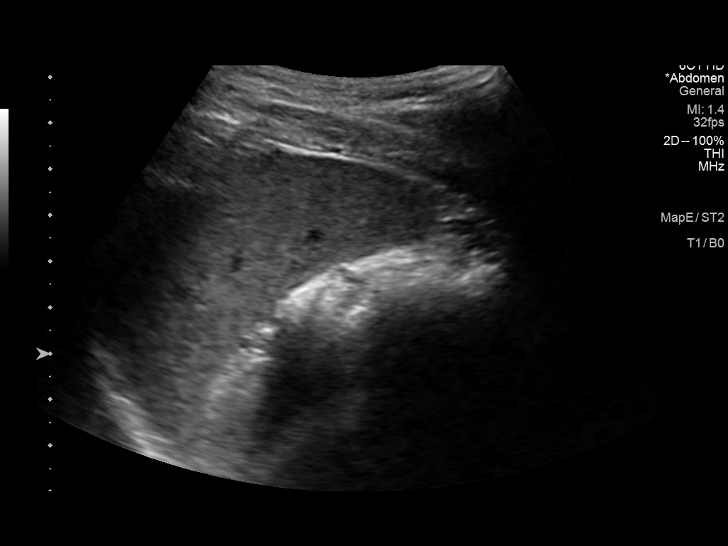
[im 57/86]
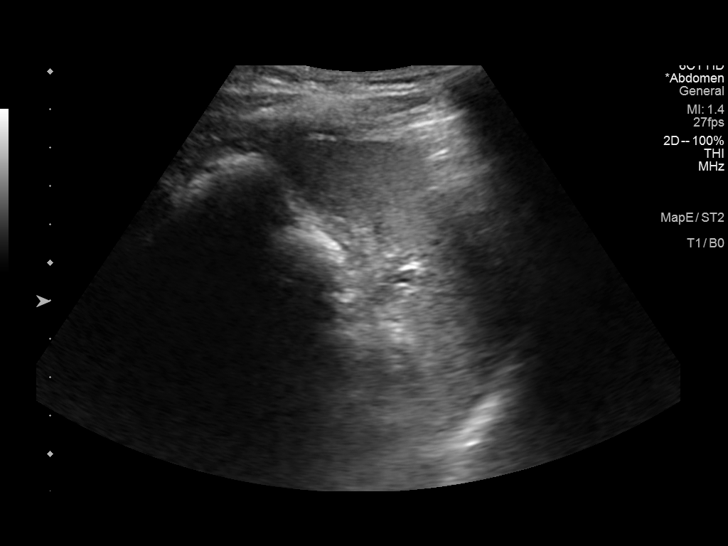
[im 64/86]
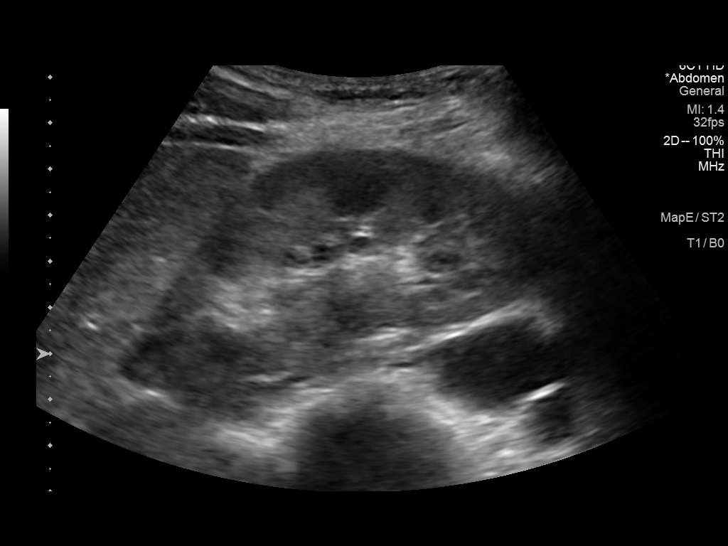
[im 71/86]
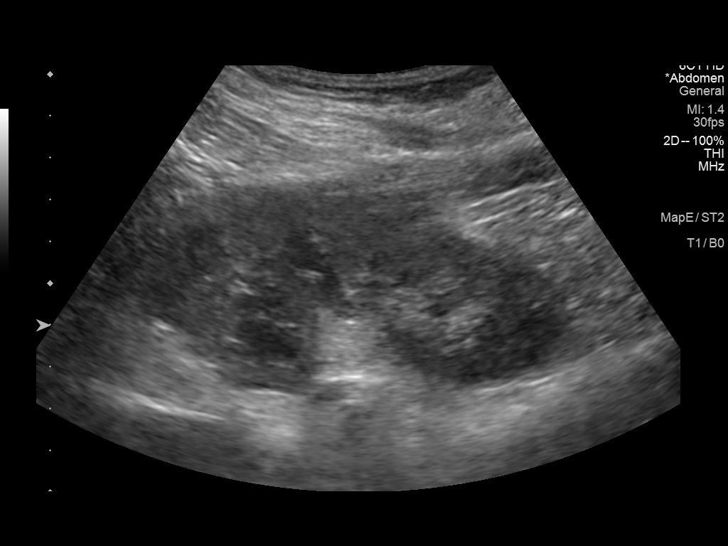
[im 78/86]
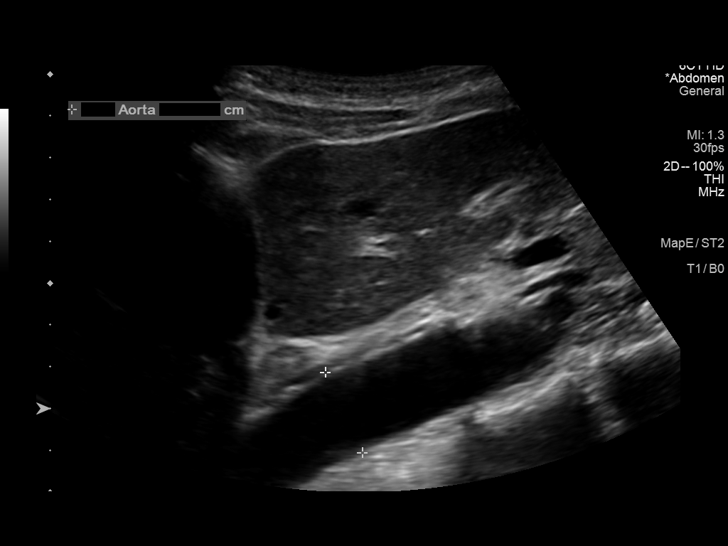
[im 86/86]
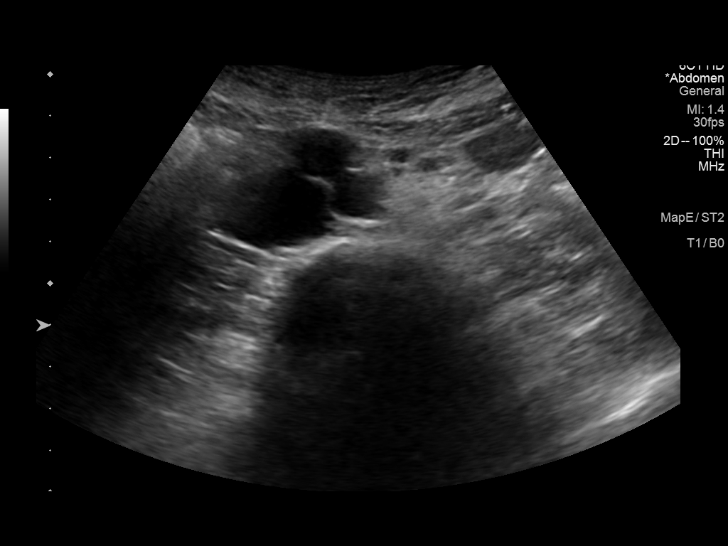

[14 of 25 positions shown; findings below may reference images not displayed]

FINDINGS: Gallbladder: No gallstones or wall thickening visualized. No
sonographic Murphy sign noted by sonographer.

Common bile duct: Diameter: 2.3 mm, normal.

Liver: No focal lesion identified. Slightly increased coarse
echogenicity of the liver parenchyma, unchanged. This could
represent hepatic steatosis. Portal vein is patent on color Doppler
imaging with normal direction of blood flow towards the liver.

IVC: No abnormality visualized.

Pancreas: Visualized portion unremarkable.

Spleen: Size and appearance within normal limits.

Right Kidney: Length: 10.0 cm. Echogenicity within normal limits. No
mass or hydronephrosis visualized.

Left Kidney: Length: 9.5 cm. Echogenicity within normal limits. No
mass or hydronephrosis visualized.

Abdominal aorta: No aneurysm visualized.

Other findings: None.
IMPRESSION: No acute abnormalities. Slight increased echogenicity of the liver
parenchyma which could represent hepatic steatosis, unchanged since
the prior study

## 2022-08-27 ENCOUNTER — Other Ambulatory Visit: Payer: Self-pay | Admitting: Sports Medicine

## 2022-08-27 ENCOUNTER — Ambulatory Visit
Admission: RE | Admit: 2022-08-27 | Discharge: 2022-08-27 | Disposition: A | Discharge status: Home / Self Care | Payer: 59 | Source: Ambulatory Visit | Attending: Sports Medicine | Admitting: Sports Medicine

## 2022-08-27 DIAGNOSIS — M25571 Pain in right ankle and joints of right foot: Secondary | ICD-10-CM

## 2023-09-09 ENCOUNTER — Other Ambulatory Visit: Payer: Self-pay | Admitting: Family Medicine

## 2023-09-09 DIAGNOSIS — Z1231 Encounter for screening mammogram for malignant neoplasm of breast: Secondary | ICD-10-CM

## 2023-09-30 IMAGING — MG MM DIGITAL DIAGNOSTIC UNILAT*L* W/ TOMO W/ CAD
4 series · 4 of 12 positions shown · non-contrast
Comparison: Previous exam(s).

CLINICAL DATA: Short-term follow-up for a probably benign left
breast asymmetry, assessed as a screening recall on 01/09/2021.

EXAM:
DIGITAL DIAGNOSTIC UNILATERAL LEFT MAMMOGRAM WITH TOMOSYNTHESIS AND
CAD
TECHNIQUE: Left digital diagnostic mammography and breast tomosynthesis was
performed. The images were evaluated with computer-aided detection.

[L CC synth-2D]
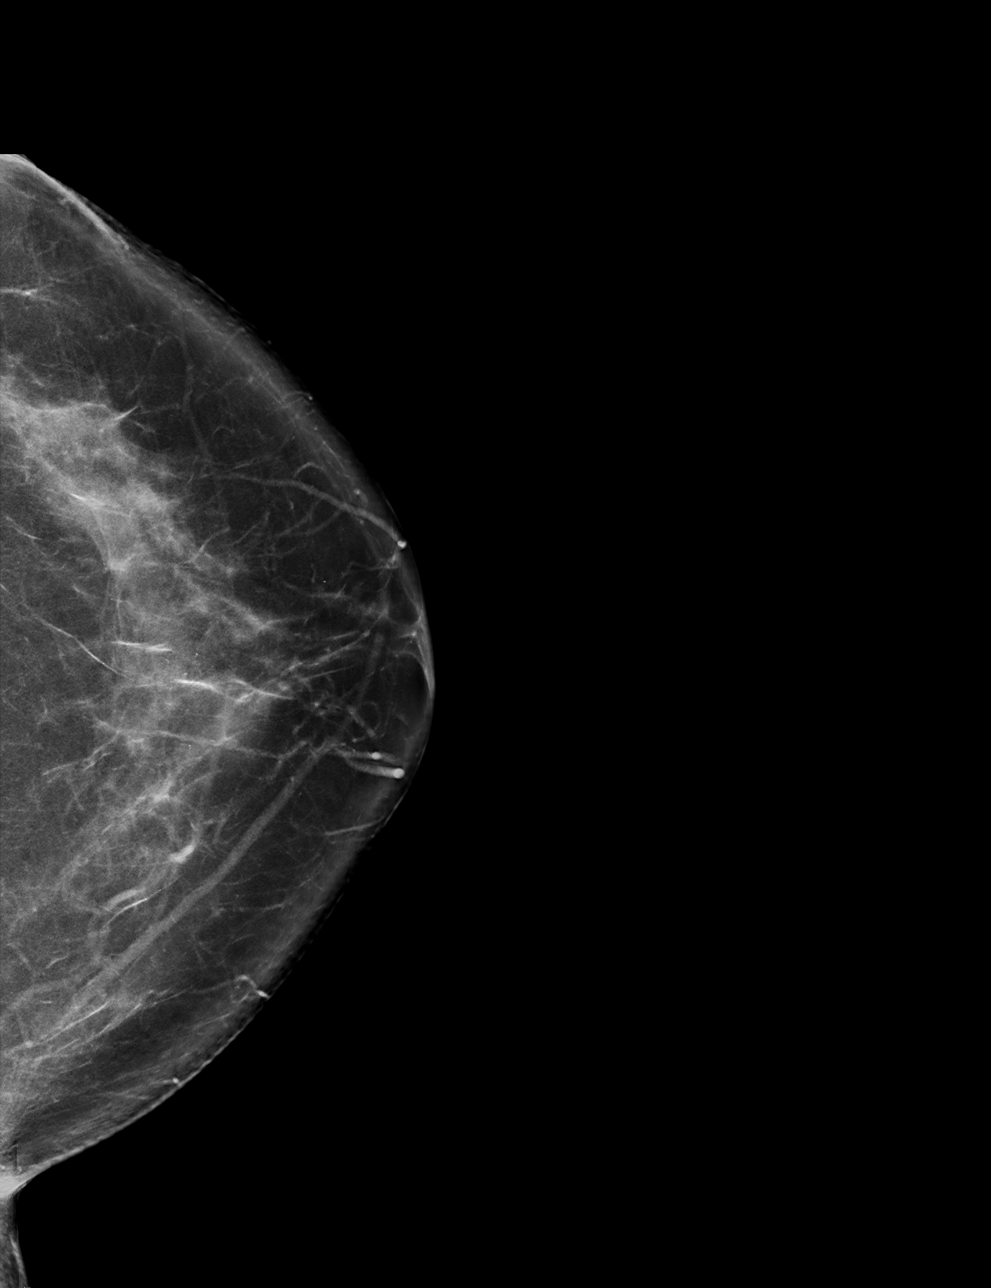

[L MLO synth-2D]
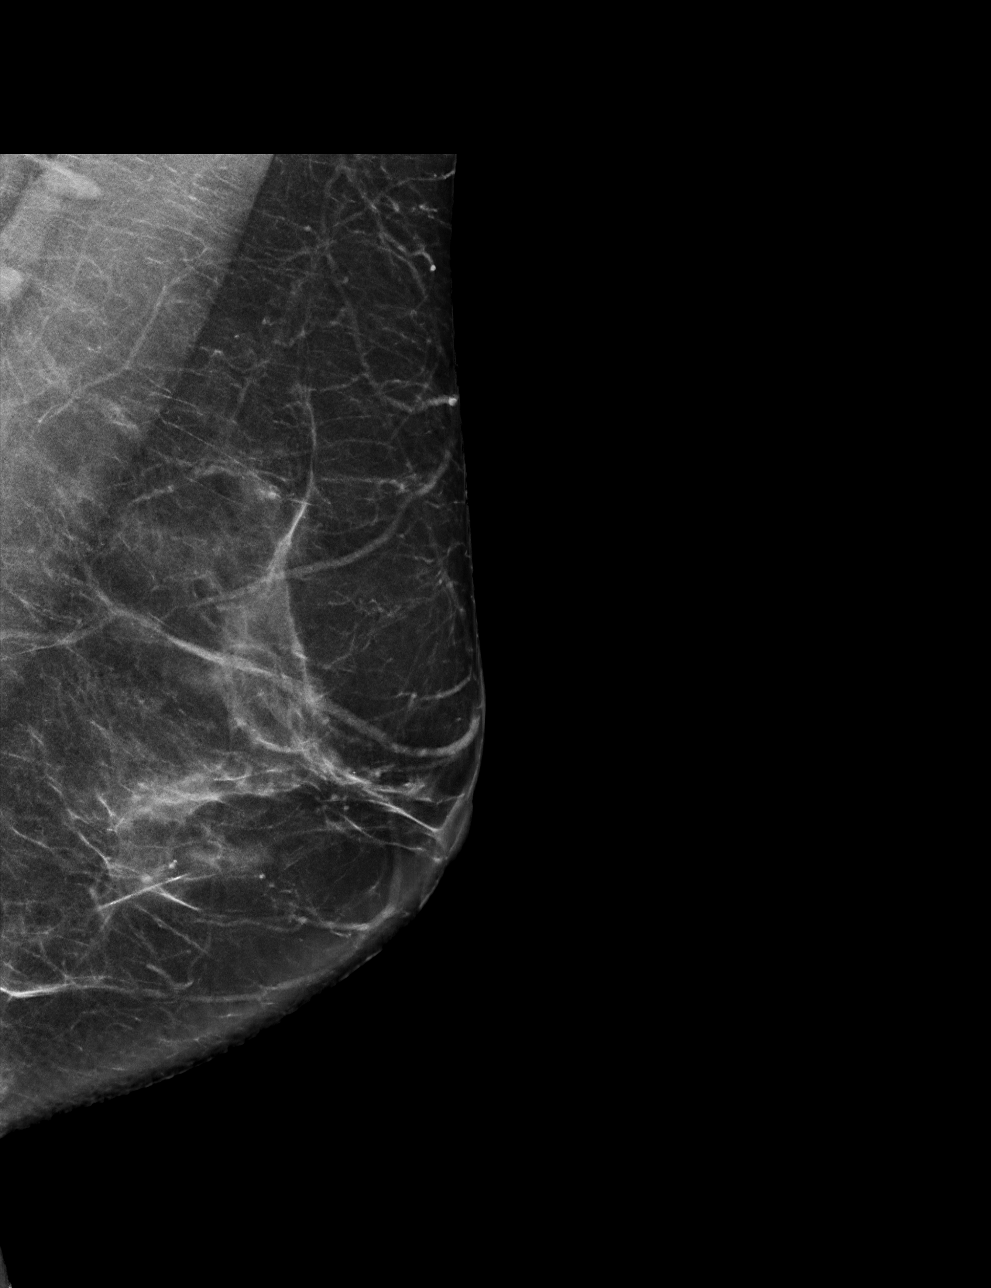

[L MLO tomo · tomo slice 36/71.0]
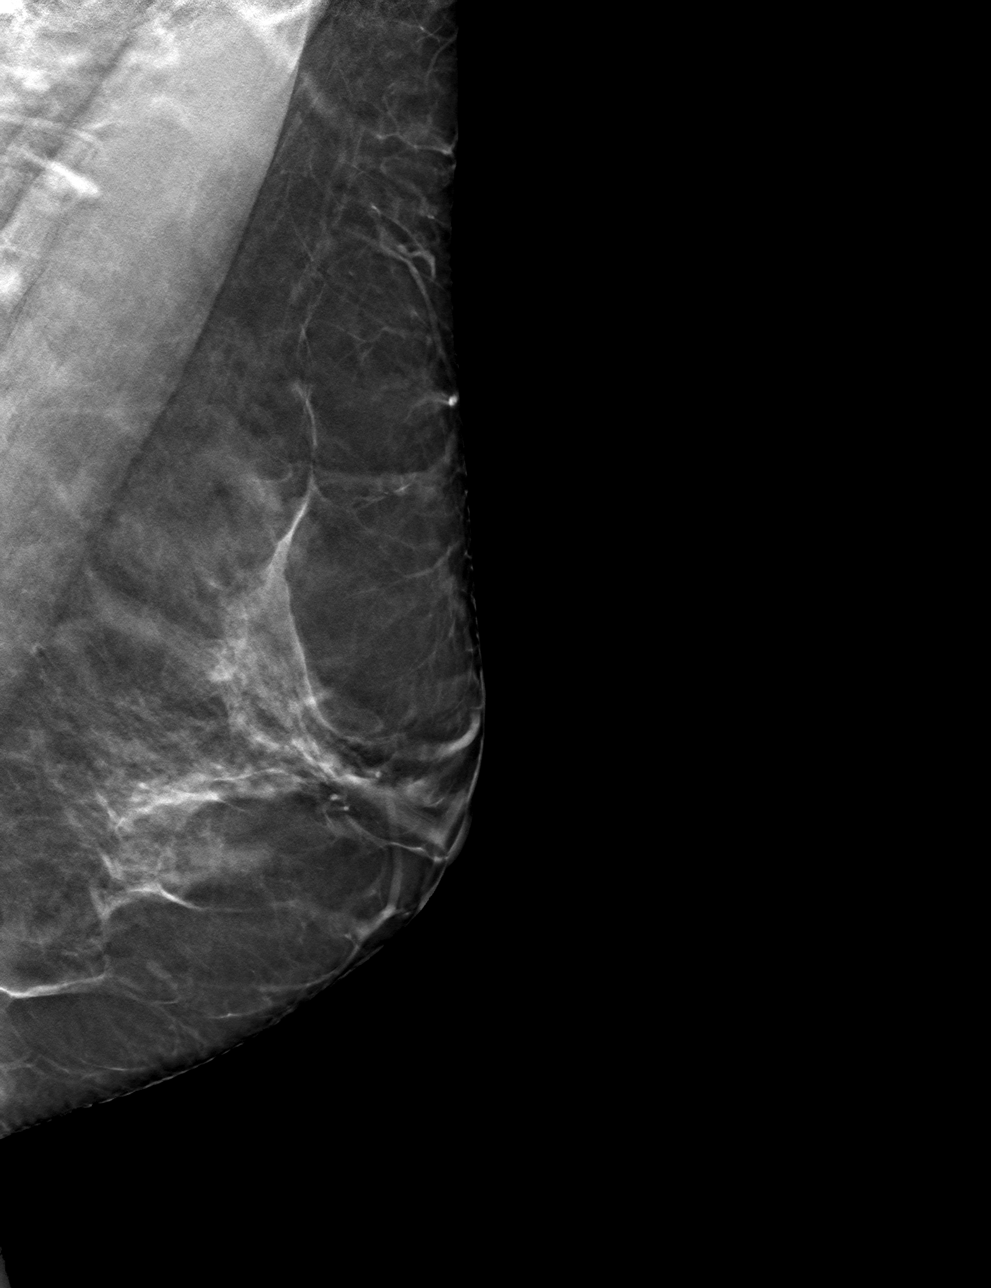

[L CC tomo · tomo slice 37/73.0]
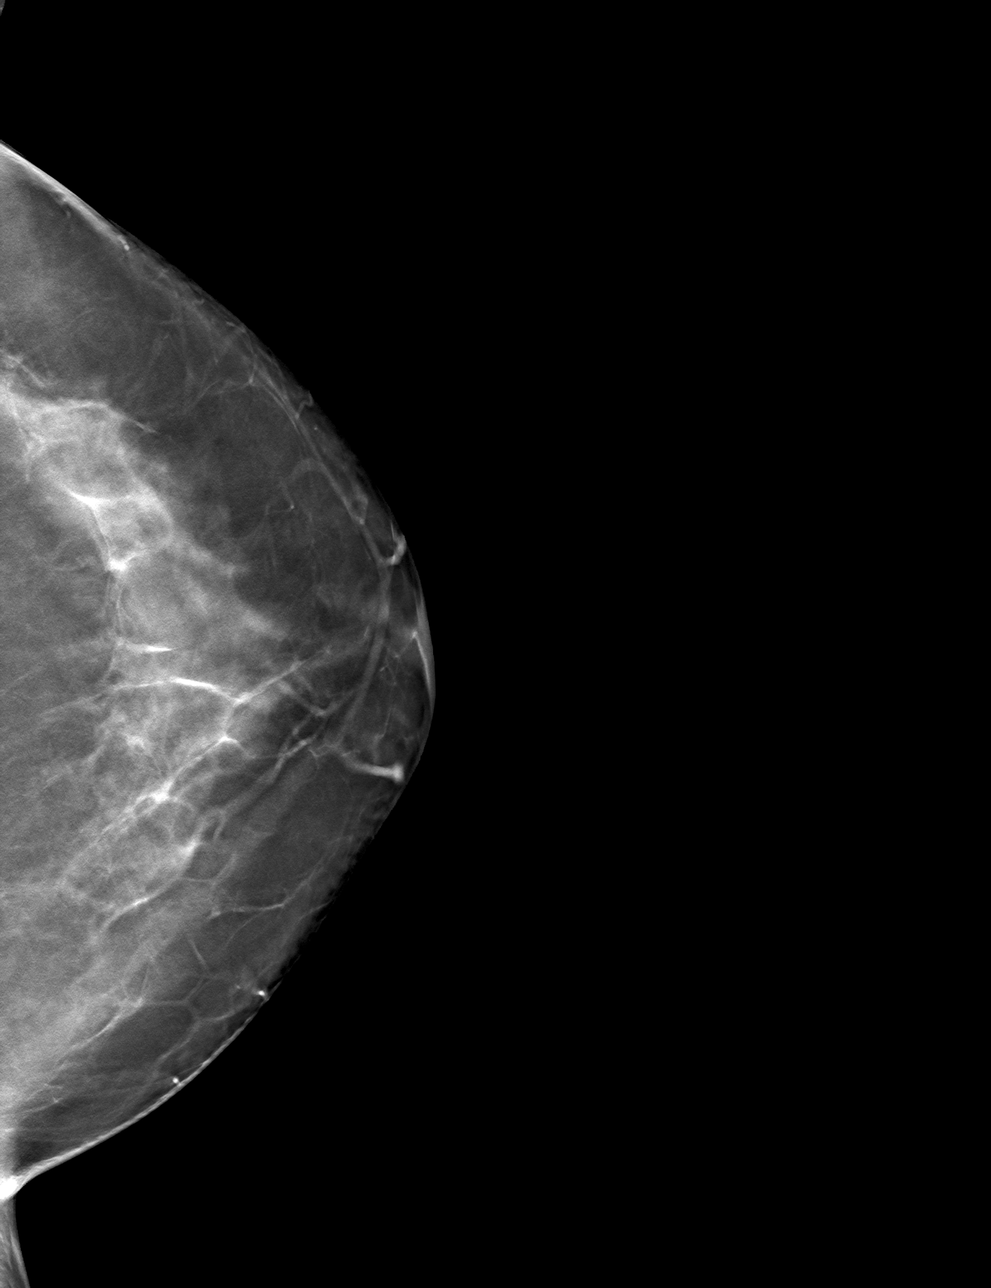

[4 of 12 positions shown; findings below may reference images not displayed]

ACR Breast Density Category c: The breast tissue is heterogeneously
dense, which may obscure small masses.
FINDINGS: The asymmetry noted in the upper left breast on the prior screening
MLO view is less apparent, and currently similar to several prior
mammograms consistent with normal fibroglandular tissue. There are
no masses or areas of architectural distortion. There are no
suspicious calcifications.
IMPRESSION: No evidence of breast malignancy.

RECOMMENDATION:
Return to annual screening mammography. Last screening mammogram
performed 12/14/2020.

I have discussed the findings and recommendations with the patient.
If applicable, a reminder letter will be sent to the patient
regarding the next appointment.

BI-RADS CATEGORY  1: Negative.

## 2023-11-04 ENCOUNTER — Ambulatory Visit
Admission: RE | Admit: 2023-11-04 | Discharge: 2023-11-04 | Disposition: A | Discharge status: Home / Self Care | Payer: 59 | Source: Ambulatory Visit | Attending: Family Medicine | Admitting: Family Medicine

## 2023-11-04 DIAGNOSIS — Z1231 Encounter for screening mammogram for malignant neoplasm of breast: Secondary | ICD-10-CM

## 2024-09-21 ENCOUNTER — Other Ambulatory Visit: Payer: Self-pay | Admitting: Nurse Practitioner

## 2024-09-21 DIAGNOSIS — N949 Unspecified condition associated with female genital organs and menstrual cycle: Secondary | ICD-10-CM

## 2024-10-12 ENCOUNTER — Ambulatory Visit
Admission: RE | Admit: 2024-10-12 | Discharge: 2024-10-12 | Disposition: A | Source: Ambulatory Visit | Attending: Nurse Practitioner | Admitting: Nurse Practitioner

## 2024-10-12 ENCOUNTER — Other Ambulatory Visit: Payer: Self-pay | Admitting: Obstetrics and Gynecology

## 2024-10-12 DIAGNOSIS — N949 Unspecified condition associated with female genital organs and menstrual cycle: Secondary | ICD-10-CM

## 2024-10-12 DIAGNOSIS — Z1231 Encounter for screening mammogram for malignant neoplasm of breast: Secondary | ICD-10-CM

## 2024-11-09 ENCOUNTER — Ambulatory Visit
